# Patient Record
Sex: Male | Born: 1985
Health system: Southern US, Community
[De-identification: ages and names within clinical notes are randomized; demographics above are authoritative.]

## PROBLEM LIST (undated history)

## (undated) DIAGNOSIS — G4733 Obstructive sleep apnea (adult) (pediatric): Secondary | ICD-10-CM

## (undated) HISTORY — DX: Obstructive sleep apnea (adult) (pediatric): G47.33

---
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
## 2012-11-02 ENCOUNTER — Encounter (HOSPITAL_COMMUNITY): Payer: Self-pay | Admitting: Emergency Medicine

## 2012-11-02 ENCOUNTER — Emergency Department (INDEPENDENT_AMBULATORY_CARE_PROVIDER_SITE_OTHER)
Admission: EM | Admit: 2012-11-02 | Discharge: 2012-11-02 | Disposition: A | Discharge status: Home / Self Care | Payer: Self-pay | Source: Home / Self Care | Attending: Family Medicine | Admitting: Family Medicine

## 2012-11-02 DIAGNOSIS — J020 Streptococcal pharyngitis: Secondary | ICD-10-CM

## 2012-11-02 LAB — POCT RAPID STREP A: Streptococcus, Group A Screen (Direct): POSITIVE — AB

## 2012-11-02 MED ORDER — AMOXICILLIN 500 MG PO CAPS: 500.0000 mg | ORAL_CAPSULE | Freq: Three times a day (TID) | ORAL | Status: DC

## 2012-11-02 NOTE — ED Notes (Signed)
Pt c/o cough x1 week... Sx include: cough w/yellow/green sputum, sore throat... Denies: fevers, vomiting, nauseas, diarrhea, headaches... Pt is alert w/no signs of distress.

## 2012-11-02 NOTE — ED Provider Notes (Signed)
History     CSN: [id number]  Arrival date & time 11/02/12  1253   First MD Initiated Contact with Patient 11/02/12 1324      Chief Complaint  Patient presents with  . Cough    (Consider location/radiation/quality/duration/timing/severity/associated sxs/prior treatment) Patient is a 26 y.o. male presenting with cough. The history is provided by the patient.  Cough This is a new problem. The current episode started more than 1 week ago. The problem has been gradually worsening (worse past 2-3 days). The cough is non-productive. The maximum temperature recorded prior to his arrival was 100 to 100.9 F. Associated symptoms include chills and sore throat. Pertinent negatives include no rhinorrhea, no myalgias, no shortness of breath and no wheezing. He is not a smoker.    History reviewed. No pertinent past medical history.  History reviewed. No pertinent past surgical history.  No family history on file.  History  Substance Use Topics  . Smoking status: Never Smoker   . Smokeless tobacco: Not on file  . Alcohol Use: Yes      Review of Systems  Constitutional: Positive for chills.  HENT: Positive for sore throat. Negative for congestion and rhinorrhea.   Respiratory: Positive for cough. Negative for shortness of breath and wheezing.   Musculoskeletal: Negative for myalgias.    Allergies  Review of patient's allergies indicates no known allergies.  Home Medications   Current Outpatient Rx  Name  Route  Sig  Dispense  Refill  . AMOXICILLIN 500 MG PO CAPS   Oral   Take 1 capsule (500 mg total) by mouth 3 (three) times daily.   30 capsule   0     BP 117/84  Pulse 71  Temp 97.9 F (36.6 C) (Oral)  Resp 18  SpO2 100%  Physical Exam  Nursing note and vitals reviewed. Constitutional: He is oriented to person, place, and time. He appears well-developed and well-nourished.  HENT:  Head: Normocephalic.  Right Ear: External ear normal.  Left Ear: External ear  normal.  Mouth/Throat: Uvula is midline. No uvula swelling. Oropharyngeal exudate and posterior oropharyngeal erythema present. No tonsillar abscesses.  Pulmonary/Chest: Breath sounds normal.  Neurological: He is alert and oriented to person, place, and time.  Skin: Skin is warm and dry. No rash noted.    ED Course  Procedures (including critical care time)  Labs Reviewed  POCT RAPID STREP A ([facility] ONLY) - Abnormal; Notable for the following:    Streptococcus, Group A Screen (Direct) POSITIVE (*)     All other components within normal limits   No results found.   1. Strep sore throat       MDM  Strep pos.        Linna Hoff, MD 11/02/12 1622

## 2012-12-03 ENCOUNTER — Emergency Department (INDEPENDENT_AMBULATORY_CARE_PROVIDER_SITE_OTHER)
Admission: EM | Admit: 2012-12-03 | Discharge: 2012-12-03 | Disposition: A | Discharge status: Home / Self Care | Payer: Self-pay | Source: Home / Self Care | Attending: Emergency Medicine | Admitting: Emergency Medicine

## 2012-12-03 ENCOUNTER — Encounter (HOSPITAL_COMMUNITY): Payer: Self-pay | Admitting: Emergency Medicine

## 2012-12-03 DIAGNOSIS — J020 Streptococcal pharyngitis: Secondary | ICD-10-CM

## 2012-12-03 LAB — POCT RAPID STREP A: Streptococcus, Group A Screen (Direct): POSITIVE — AB

## 2012-12-03 MED ORDER — CEPHALEXIN 500 MG PO CAPS: 500.0000 mg | ORAL_CAPSULE | Freq: Three times a day (TID) | ORAL | Status: AC

## 2012-12-03 MED ORDER — AMOXICILLIN 500 MG PO CAPS: 500.0000 mg | ORAL_CAPSULE | Freq: Three times a day (TID) | ORAL | Status: DC

## 2012-12-03 MED ORDER — ACETAMINOPHEN-CODEINE #3 300-30 MG PO TABS: 1.0000 | ORAL_TABLET | Freq: Four times a day (QID) | ORAL | Status: DC | PRN

## 2012-12-03 NOTE — ED Provider Notes (Signed)
History     CSN: [id number]  Arrival date & time 12/03/12  1478   First MD Initiated Contact with Patient 12/03/12 [phone]      Chief Complaint  Patient presents with  . Sore Throat    sore throat since sunday. rx dx of strep a month ago. having same symptoms    (Consider location/radiation/quality/duration/timing/severity/associated sxs/prior treatment) HPI Comments: Patient presents urgent care complaining of a severe sore throat that started Monday, about 4 days ago. With the same symptoms he had about a month ago where he was treated with amoxicillin with a confirmed streptococcal pharyngitis. He describes he completed a full 10 day treatment course and felt much better. Is not interval this last Monday started having symptoms again. No cough, or congestion  Patient is a 26 y.o. male presenting with pharyngitis. The history is provided by the patient.  Sore Throat This is a new problem. The current episode started more than 2 days ago. The problem occurs constantly. The problem has not changed since onset.Pertinent negatives include no abdominal pain, no headaches and no shortness of breath. The symptoms are aggravated by swallowing. Nothing relieves the symptoms. He has tried acetaminophen for the symptoms. The treatment provided no relief.    History reviewed. No pertinent past medical history.  History reviewed. No pertinent past surgical history.  No family history on file.  History  Substance Use Topics  . Smoking status: Never Smoker   . Smokeless tobacco: Not on file  . Alcohol Use: Yes      Review of Systems  Constitutional: Positive for activity change and appetite change. Negative for fever, chills and fatigue.  HENT: Positive for sore throat and trouble swallowing. Negative for ear pain, neck pain, neck stiffness and ear discharge.   Respiratory: Negative for cough and shortness of breath.   Gastrointestinal: Negative for abdominal pain.  Skin: Negative for rash  and wound.  Neurological: Negative for dizziness and headaches.    Allergies  Review of patient's allergies indicates no known allergies.  Home Medications   Current Outpatient Rx  Name  Route  Sig  Dispense  Refill  . ACETAMINOPHEN-CODEINE #3 300-30 MG PO TABS   Oral   Take 1-2 tablets by mouth every 6 (six) hours as needed for pain.   15 tablet   0   . AMOXICILLIN 500 MG PO CAPS   Oral   Take 1 capsule (500 mg total) by mouth 3 (three) times daily.   30 capsule   0   . CEPHALEXIN 500 MG PO CAPS   Oral   Take 1 capsule (500 mg total) by mouth 3 (three) times daily.   20 capsule   0     BP 128/72  Pulse 86  Temp 99.3 F (37.4 C) (Oral)  Resp 20  SpO2 97%  Physical Exam  Nursing note and vitals reviewed. Constitutional: Vital signs are normal. He appears well-developed and well-nourished.  Non-toxic appearance. He does not have a sickly appearance. He does not appear ill. No distress.  HENT:  Head: Normocephalic.  Mouth/Throat: Uvula is midline. Oropharyngeal exudate and posterior oropharyngeal erythema present. No posterior oropharyngeal edema or tonsillar abscesses.  Eyes: Conjunctivae normal are normal.  Neck: No JVD present. No tracheal deviation present. No thyromegaly present.  Cardiovascular: Normal rate.   Pulmonary/Chest: Effort normal and breath sounds normal.  Abdominal: Soft.  Lymphadenopathy:    He has cervical adenopathy.  Neurological: He is alert.  Skin: No rash noted. No erythema.  ED Course  Procedures (including critical care time)  Labs Reviewed  POCT RAPID STREP A ([facility] ONLY) - Abnormal; Notable for the following:    Streptococcus, Group A Screen (Direct) POSITIVE (*)     All other components within normal limits   No results found.   1. Streptococcal sore throat       MDM  Streptococcal pharyngitis. On this instance we will treat Mr. Resendes with a cephalosporin 10 day treatment course as he completed a penicillin  course about a month ago. No signs of a peritonsillar abscess. Patient was        Jimmie Molly, MD 12/03/12 1019

## 2012-12-03 NOTE — ED Notes (Signed)
Pt c/o severe sore throat and temp last p.m. Pt states he had a recent dx of strep a month ago and feels like it never really cleared completely. Pt did finish all meds. Same symptoms started back this past Sunday. Pt denies n/v/d

## 2014-07-23 ENCOUNTER — Encounter (HOSPITAL_COMMUNITY): Payer: Self-pay | Admitting: Emergency Medicine

## 2014-07-23 ENCOUNTER — Emergency Department (HOSPITAL_COMMUNITY)
Admission: EM | Admit: 2014-07-23 | Discharge: 2014-07-23 | Disposition: A | Discharge status: Home / Self Care | Payer: Self-pay | Attending: Emergency Medicine | Admitting: Emergency Medicine

## 2014-07-23 DIAGNOSIS — Z792 Long term (current) use of antibiotics: Secondary | ICD-10-CM | POA: Insufficient documentation

## 2014-07-23 DIAGNOSIS — L02214 Cutaneous abscess of groin: Secondary | ICD-10-CM

## 2014-07-23 DIAGNOSIS — L02219 Cutaneous abscess of trunk, unspecified: Secondary | ICD-10-CM | POA: Insufficient documentation

## 2014-07-23 DIAGNOSIS — L03319 Cellulitis of trunk, unspecified: Principal | ICD-10-CM

## 2014-07-23 DIAGNOSIS — Z79899 Other long term (current) drug therapy: Secondary | ICD-10-CM | POA: Insufficient documentation

## 2014-07-23 MED ORDER — CEPHALEXIN 500 MG PO CAPS: 500.0000 mg | ORAL_CAPSULE | Freq: Four times a day (QID) | ORAL | Status: DC

## 2014-07-23 MED ORDER — NAPROXEN 500 MG PO TABS: 500.0000 mg | ORAL_TABLET | Freq: Two times a day (BID) | ORAL | Status: DC

## 2014-07-23 NOTE — ED Notes (Signed)
Pt in c/o hard bump to left groin area since Thursday, denies drainage

## 2014-07-23 NOTE — ED Provider Notes (Signed)
CSN: [id number]     Arrival date & time 07/23/14  1527 History  This chart was scribed for non-physician practitioner working with Rolan BuccoMelanie Belfi, MD, by Jarvis Morganaylor Ferguson, ED Scribe. This patient was seen in room TR11C/TR11C and the patient's care was started at 3:51 PM.    Chief Complaint  Patient presents with  . Abscess      The history is provided by the patient. No language interpreter was used.   HPI Comments: Lonnie Cantrell is a 28 y.o. male who presents to the Emergency Department complaining of constant, gradually worsening, "sore and hard", abscess to his left groin since Thursday. Patient states he has had abscesses before. Patient states that he put a hot compress on it last night but states it was too painful. Patient denies any drainage to the area, penile discharge or penile swelling.    History reviewed. No pertinent past medical history. History reviewed. No pertinent past surgical history. History reviewed. No pertinent family history. History  Substance Use Topics  . Smoking status: Never Smoker   . Smokeless tobacco: Not on file  . Alcohol Use: Yes    Review of Systems  Genitourinary: Negative for discharge, penile swelling and penile pain.  Skin:       Abscess to left groin area  All other systems reviewed and are negative.     Allergies  Review of patient's allergies indicates no known allergies.  Home Medications   Prior to Admission medications   Medication Sig Start Date End Date Taking? Authorizing Provider  acetaminophen-codeine (TYLENOL #3) 300-30 MG per tablet Take 1-2 tablets by mouth every 6 (six) hours as needed for pain. 12/03/12   Jimmie MollyPaolo Coll, MD  amoxicillin (AMOXIL) 500 MG capsule Take 1 capsule (500 mg total) by mouth 3 (three) times daily. 11/02/12   Linna HoffJames D Kindl, MD  cephALEXin (KEFLEX) 500 MG capsule Take 1 capsule (500 mg total) by mouth 4 (four) times daily. 07/23/14   Dariyon Urquilla L Electra Paladino, PA-C   BP 126/66  Pulse 75  Temp(Src)  98.5 F (36.9 C) (Oral)  Resp 20  SpO2 97%  Physical Exam  Nursing note and vitals reviewed. Constitutional: He is oriented to person, place, and time. He appears well-developed and well-nourished. No distress.  HENT:  Head: Normocephalic and atraumatic.  Right Ear: External ear normal.  Left Ear: External ear normal.  Nose: Nose normal.  Mouth/Throat: Oropharynx is clear and moist.  Eyes: Conjunctivae are normal.  Neck: Normal range of motion. Neck supple.  Cardiovascular: Normal rate, regular rhythm and normal heart sounds.   Pulmonary/Chest: Effort normal and breath sounds normal.  Abdominal: Soft.  Musculoskeletal: Normal range of motion.  Neurological: He is alert and oriented to person, place, and time.  Skin: Skin is warm and dry. He is not diaphoretic.     Psychiatric: He has a normal mood and affect.    ED Course  Procedures (including critical care time) Medications - No data to display   DIAGNOSTIC STUDIES: Oxygen Saturation is 97% on RA, normal by my interpretation.    COORDINATION OF CARE:    Labs Review Labs Reviewed - No data to display  Imaging Review No results found.   EKG Interpretation None      MDM   Final diagnoses:  Abscess of groin, left    Filed Vitals:   07/23/14 1537  BP: 126/66  Pulse: 75  Temp: 98.5 F (36.9 C)  Resp: 20   Afebrile, NAD, non-toxic appearing, AAOx4.  Patient with skin abscess, area is indurated, no fluctuance. No surrounding erythema.  No drained at this visit. Encouraged home warm soaks and compresses at home.  Return precautions discussed. Patient is agreeable to plan. Patient is stable at time of discharge     I personally performed the services described in this documentation, which was scribed in my presence. The recorded information has been reviewed and is accurate.      Jeannetta Ellis, PA-C 07/23/14 1621

## 2014-07-23 NOTE — Discharge Instructions (Signed)
Please follow up with your primary care physician in 1-2 days. Please return to the ER if your abscess worsens or shows signs of needing draining. If you do not have one please call the [facility] number listed above. Please take your antibiotic until completion. Please read all discharge instructions and return precautions.    Abscess An abscess is an infected area that contains a collection of pus and debris.It can occur in almost any part of the body. An abscess is also known as a furuncle or boil. CAUSES  An abscess occurs when tissue gets infected. This can occur from blockage of oil or sweat glands, infection of hair follicles, or a minor injury to the skin. As the body tries to fight the infection, pus collects in the area and creates pressure under the skin. This pressure causes pain. People with weakened immune systems have difficulty fighting infections and get certain abscesses more often.  SYMPTOMS Usually an abscess develops on the skin and becomes a painful mass that is red, warm, and tender. If the abscess forms under the skin, you may feel a moveable soft area under the skin. Some abscesses break open (rupture) on their own, but most will continue to get worse without care. The infection can spread deeper into the body and eventually into the bloodstream, causing you to feel ill.  DIAGNOSIS  Your caregiver will take your medical history and perform a physical exam. A sample of fluid may also be taken from the abscess to determine what is causing your infection. TREATMENT  Your caregiver may prescribe antibiotic medicines to fight the infection. However, taking antibiotics alone usually does not cure an abscess. Your caregiver may need to make a small cut (incision) in the abscess to drain the pus. In some cases, gauze is packed into the abscess to reduce pain and to continue draining the area. HOME CARE INSTRUCTIONS   Only take over-the-counter or prescription  medicines for pain, discomfort, or fever as directed by your caregiver.  If you were prescribed antibiotics, take them as directed. Finish them even if you start to feel better.  If gauze is used, follow your caregiver's directions for changing the gauze.  To avoid spreading the infection:  Keep your draining abscess covered with a bandage.  Wash your hands well.  Do not share personal care items, towels, or whirlpools with others.  Avoid skin contact with others.  Keep your skin and clothes clean around the abscess.  Keep all follow-up appointments as directed by your caregiver. SEEK MEDICAL CARE IF:   You have increased pain, swelling, redness, fluid drainage, or bleeding.  You have muscle aches, chills, or a general ill feeling.  You have a fever. MAKE SURE YOU:   Understand these instructions.  Will watch your condition.  Will get help right away if you are not doing well or get worse. Document Released: 09/25/2005 Document Revised: 06/16/2012 Document Reviewed: 02/28/2012 [facility] Patient Information 2015 [employer] [place]. This information is not intended to replace advice given to you by your health care provider. Make sure you discuss any questions you have with your health care provider.

## 2014-07-24 NOTE — ED Provider Notes (Signed)
Medical screening examination/treatment/procedure(s) were performed by non-physician practitioner and as supervising physician I was immediately available for consultation/collaboration.   EKG Interpretation None        Aela Bohan, MD 07/24/14 0008 

## 2014-07-28 ENCOUNTER — Encounter (HOSPITAL_COMMUNITY): Payer: Self-pay | Admitting: Emergency Medicine

## 2014-07-28 ENCOUNTER — Emergency Department (HOSPITAL_COMMUNITY)
Admission: EM | Admit: 2014-07-28 | Discharge: 2014-07-28 | Disposition: A | Discharge status: Home / Self Care | Payer: Self-pay | Attending: Emergency Medicine | Admitting: Emergency Medicine

## 2014-07-28 DIAGNOSIS — Z792 Long term (current) use of antibiotics: Secondary | ICD-10-CM | POA: Insufficient documentation

## 2014-07-28 DIAGNOSIS — L03119 Cellulitis of unspecified part of limb: Secondary | ICD-10-CM

## 2014-07-28 DIAGNOSIS — Z79899 Other long term (current) drug therapy: Secondary | ICD-10-CM | POA: Insufficient documentation

## 2014-07-28 DIAGNOSIS — L02419 Cutaneous abscess of limb, unspecified: Secondary | ICD-10-CM | POA: Insufficient documentation

## 2014-07-28 DIAGNOSIS — R229 Localized swelling, mass and lump, unspecified: Secondary | ICD-10-CM | POA: Insufficient documentation

## 2014-07-28 DIAGNOSIS — L989 Disorder of the skin and subcutaneous tissue, unspecified: Secondary | ICD-10-CM

## 2014-07-28 MED ORDER — HYDROCODONE-ACETAMINOPHEN 5-325 MG PO TABS: 1.0000 | ORAL_TABLET | Freq: Four times a day (QID) | ORAL | Status: DC | PRN

## 2014-07-28 MED ORDER — HYDROCODONE-ACETAMINOPHEN 5-325 MG PO TABS
1.0000 | ORAL_TABLET | Freq: Once | ORAL | Status: AC
  Administered 2014-07-28: 1 via ORAL
  Filled 2014-07-28: qty 1

## 2014-07-28 NOTE — ED Provider Notes (Signed)
CSN: [id number]     Arrival date & time 07/28/14  0001 History   First MD Initiated Contact with Patient 07/28/14 [phone]     Chief Complaint  Patient presents with  . Abscess     (Consider location/radiation/quality/duration/timing/severity/associated sxs/prior Treatment) Patient is a 28 y.o. male presenting with abscess. The history is provided by the patient.  Abscess Location:  Leg Leg abscess location:  L leg Abscess quality: itching and painful   Abscess quality: not draining, no fluctuance, no induration, no warmth and not weeping   Red streaking: no   Progression:  Unchanged Pain details:    Quality:  Burning   Severity:  Moderate   Timing:  Constant   Progression:  Unchanged Chronicity:  New Context: not diabetes, not immunosuppression, not injected drug use, not insect bite/sting and not skin injury   Relieved by:  Nothing Worsened by:  Warm compresses Associated symptoms: no fever and no nausea     History reviewed. No pertinent past medical history. History reviewed. No pertinent past surgical history. No family history on file. History  Substance Use Topics  . Smoking status: Never Smoker   . Smokeless tobacco: Not on file  . Alcohol Use: Yes    Review of Systems  Constitutional: Negative for fever.  Respiratory: Negative for shortness of breath.   Cardiovascular: Negative for chest pain.  Gastrointestinal: Negative for nausea.  Skin: Positive for wound.  Neurological: Negative for dizziness.  All other systems reviewed and are negative.     Allergies  Review of patient's allergies indicates no known allergies.  Home Medications   Prior to Admission medications   Medication Sig Start Date End Date Taking? Authorizing Provider  cephALEXin (KEFLEX) 500 MG capsule Take 1 capsule (500 mg total) by mouth 4 (four) times daily. 07/23/14  Yes Jennifer L Piepenbrink, PA-C  naproxen (NAPROSYN) 500 MG tablet Take 1 tablet (500 mg total) by mouth 2 (two) times  daily with a meal. 07/23/14  Yes Jennifer L Piepenbrink, PA-C  HYDROcodone-acetaminophen (NORCO/VICODIN) 5-325 MG per tablet Take 1 tablet by mouth every 6 (six) hours as needed for moderate pain. 07/28/14   Arman Filter, NP   BP 113/71  Pulse 71  Temp(Src) 98.7 F (37.1 C) (Oral)  Resp 18  SpO2 93% Physical Exam  Nursing note and vitals reviewed. Constitutional: He appears well-developed and well-nourished.  HENT:  Head: Normocephalic.  Eyes: Pupils are equal, round, and reactive to light.  Neck: Normal range of motion.  Cardiovascular: Normal rate.   Pulmonary/Chest: Effort normal.  Musculoskeletal: Normal range of motion.  Neurological: He is alert.  Skin: Skin is warm and dry. There is erythema.       ED Course  INCISION AND DRAINAGE Date/Time: 07/28/2014 4:09 AM Performed by: Arman Filter Authorized by: Arman Filter Consent: Verbal consent obtained. written consent not obtained. Patient understanding: patient states understanding of the procedure being performed Patient identity confirmed: verbally with patient Type: abscess Body area: lower extremity Location details: left leg Anesthesia: local infiltration Anesthetic total: 2 ml Patient sedated: no Scalpel size: 11 Needle gauge: 22 Incision type: elliptical Complexity: simple Drainage amount: none. Wound treatment: wound left open Patient tolerance: Patient tolerated the procedure well with no immediate complications.   (including critical care time) Labs Review Labs Reviewed - No data to display  Imaging Review No results found.   EKG Interpretation None      MDM   Final diagnoses:  Skin lesion of left leg  Arman FilterGail K Dixie Jafri, NP 07/28/14 [phone]

## 2014-07-28 NOTE — Discharge Instructions (Signed)
I tried to open the abscess but there was no pus within the lesion  Continue taking the antibiotic

## 2014-07-28 NOTE — ED Notes (Signed)
Pt. reports persistent abscess at left groin with no drainage seen here 07/23/2014 prescribed with antibiotic and pain medication with no improvement.

## 2014-07-28 NOTE — ED Provider Notes (Signed)
Medical screening examination/treatment/procedure(s) were performed by non-physician practitioner and as supervising physician I was immediately available for consultation/collaboration.   EKG Interpretation None       Jolena Kittle K Saide Lanuza-Rasch, MD 07/28/14 0534 

## 2018-04-03 ENCOUNTER — Ambulatory Visit (HOSPITAL_COMMUNITY)
Admission: EM | Admit: 2018-04-03 | Discharge: 2018-04-03 | Disposition: A | Discharge status: Home / Self Care | Payer: 59 | Attending: Internal Medicine | Admitting: Internal Medicine

## 2018-04-03 ENCOUNTER — Encounter (HOSPITAL_COMMUNITY): Payer: Self-pay | Admitting: Emergency Medicine

## 2018-04-03 DIAGNOSIS — Z79899 Other long term (current) drug therapy: Secondary | ICD-10-CM | POA: Insufficient documentation

## 2018-04-03 DIAGNOSIS — J029 Acute pharyngitis, unspecified: Secondary | ICD-10-CM | POA: Diagnosis not present

## 2018-04-03 LAB — POCT RAPID STREP A: Streptococcus, Group A Screen (Direct): NEGATIVE

## 2018-04-03 NOTE — Discharge Instructions (Addendum)
Sore Throat  Your rapid strep tested Negative today. We will send for a culture and call in about 2 days if results are positive. For now we will treat your sore throat as a virus with symptom management.   Please continue Tylenol or Ibuprofen for fever and pain. May try salt water gargles, cepacol lozenges, throat spray, or OTC cold relief medicine for throat discomfort. If you also have congestion take a daily anti-histamine like Zyrtec, Claritin, and a oral decongestant to help with post nasal drip that may be irritating your throat.   For cough please use delsym, robitussin or robitussin DM  Stay hydrated and drink plenty of fluids to keep your throat coated relieve irritation.

## 2018-04-03 NOTE — ED Provider Notes (Signed)
[facility]    CSN: [id number] Arrival date & time: 04/03/18  1545     History   Chief Complaint Chief Complaint  Patient presents with  . Sore Throat    HPI Lonnie Cantrell is a 32 y.o. male Patient is presenting with URI symptoms- congestion, cough, sore throat. Patient's main complaints are sore throat. Symptoms have been going on for 4-5 days. Patient has not tried anything for relief. Denies fever, nausea, vomiting, diarrhea. Denies shortness of breath and chest pain.  Denies history of asthma or smoking.   HPI  History reviewed. No pertinent past medical history.  There are no active problems to display for this patient.   History reviewed. No pertinent surgical history.     Home Medications    Prior to Admission medications   Medication Sig Start Date End Date Taking? Authorizing Provider  cephALEXin (KEFLEX) 500 MG capsule Take 1 capsule (500 mg total) by mouth 4 (four) times daily. 07/23/14   Piepenbrink, Victorino Dike, PA-C  HYDROcodone-acetaminophen (NORCO/VICODIN) 5-325 MG per tablet Take 1 tablet by mouth every 6 (six) hours as needed for moderate pain. 07/28/14   Earley Favor, NP  naproxen (NAPROSYN) 500 MG tablet Take 1 tablet (500 mg total) by mouth 2 (two) times daily with a meal. 07/23/14   Piepenbrink, Victorino Dike, PA-C    Family History History reviewed. No pertinent family history.  Social History Social History   Tobacco Use  . Smoking status: Never Smoker  Substance Use Topics  . Alcohol use: Yes  . Drug use: Yes    Types: Marijuana     Allergies   Patient has no known allergies.   Review of Systems Review of Systems  Constitutional: Negative for activity change, appetite change, fatigue and fever.  HENT: Positive for congestion, postnasal drip, rhinorrhea and sinus pressure. Negative for ear pain and sore throat.   Eyes: Negative for pain and itching.  Respiratory: Positive for cough. Negative for shortness of breath.     Cardiovascular: Negative for chest pain.  Gastrointestinal: Negative for abdominal pain, diarrhea, nausea and vomiting.  Musculoskeletal: Negative for myalgias.  Skin: Negative for rash.  Neurological: Negative for dizziness, light-headedness and headaches.     Physical Exam Triage Vital Signs ED Triage Vitals [04/03/18 1641]  Enc Vitals Group     BP 138/90     Pulse Rate 89     Resp 18     Temp 98.2 F (36.8 C)     Temp Source Oral     SpO2 98 %     Weight      Height      Head Circumference      Peak Flow      Pain Score      Pain Loc      Pain Edu?      Excl. in GC?    No data found.  Updated Vital Signs BP 138/90 (BP Location: Right Arm)   Pulse 89   Temp 98.2 F (36.8 C) (Oral)   Resp 18   SpO2 98%   Visual Acuity Right Eye Distance:   Left Eye Distance:   Bilateral Distance:    Right Eye Near:   Left Eye Near:    Bilateral Near:     Physical Exam  Constitutional: He appears well-developed and well-nourished.  HENT:  Head: Normocephalic and atraumatic.  Bilateral TMs nonerythematous, nasal mucosa erythematous with clear rhinorrhea present, posterior oropharynx erythematous, moderate tonsillar enlargement, white exudate present on left  tonsil, no uvula deviation or swelling  Eyes: Conjunctivae are normal.  Neck: Neck supple.  Cardiovascular: Normal rate and regular rhythm.  No murmur heard. Pulmonary/Chest: Effort normal and breath sounds normal. No respiratory distress.  Breathing comfortably at rest, CTA BL  Abdominal: Soft. There is no tenderness.  Musculoskeletal: He exhibits no edema.  Neurological: He is alert.  Skin: Skin is warm and dry.  Psychiatric: He has a normal mood and affect.  Nursing note and vitals reviewed.    UC Treatments / Results  Labs (all labs ordered are listed, but only abnormal results are displayed) Labs Reviewed  CULTURE, GROUP A STREP [facility])  POCT RAPID STREP A    EKG None Radiology No results  found.  Procedures Procedures (including critical care time)  Medications Ordered in UC Medications - No data to display   Initial Impression / Assessment and Plan / UC Course  I have reviewed the triage vital signs and the nursing notes.  Pertinent labs & imaging results that were available during my care of the patient were reviewed by me and considered in my medical decision making (see chart for details).     Patient tested negative for strep. No evidence of peritonsillar abscess or retropharyngeal abscess. Patient is nontoxic appearing, no drooling, dysphagia, muffled voice, or tripoding. No trismus.  Sore throat likely viral versus postnasal drainage.  Will recommend beginning daily allergy pill.  Discussed other further symptomatic management of sore throat as symptoms resolve.  Expect gradual self resolution. Discussed strict return precautions. Patient verbalized understanding and is agreeable with plan.     Final Clinical Impressions(s) / UC Diagnoses   Final diagnoses:  Sore throat    ED Discharge Orders    None       Controlled Substance Prescriptions [place] Controlled Substance Registry consulted? Not Applicable   Lew DawesWieters, Halleigh Comes C, [place]PA-C 04/03/18 1723

## 2018-04-03 NOTE — ED Triage Notes (Signed)
Pt sts sore throat x 3 days  

## 2018-04-06 LAB — CULTURE, GROUP A STREP (THRC)

## 2018-10-20 DIAGNOSIS — J209 Acute bronchitis, unspecified: Secondary | ICD-10-CM | POA: Diagnosis not present

## 2018-10-20 DIAGNOSIS — J9801 Acute bronchospasm: Secondary | ICD-10-CM | POA: Diagnosis not present

## 2018-10-20 DIAGNOSIS — R0982 Postnasal drip: Secondary | ICD-10-CM | POA: Diagnosis not present

## 2018-12-02 ENCOUNTER — Encounter: Payer: Self-pay | Admitting: Osteopathic Medicine

## 2018-12-02 ENCOUNTER — Ambulatory Visit (INDEPENDENT_AMBULATORY_CARE_PROVIDER_SITE_OTHER): Payer: 59 | Admitting: Osteopathic Medicine

## 2018-12-02 VITALS — BP 125/67 | HR 75 | Temp 98.4°F | Ht 67.25 in | Wt 277.7 lb

## 2018-12-02 DIAGNOSIS — R0681 Apnea, not elsewhere classified: Secondary | ICD-10-CM | POA: Diagnosis not present

## 2018-12-02 DIAGNOSIS — Z6841 Body Mass Index (BMI) 40.0 and over, adult: Secondary | ICD-10-CM | POA: Insufficient documentation

## 2018-12-02 DIAGNOSIS — Z9189 Other specified personal risk factors, not elsewhere classified: Secondary | ICD-10-CM | POA: Diagnosis not present

## 2018-12-02 DIAGNOSIS — L659 Nonscarring hair loss, unspecified: Secondary | ICD-10-CM | POA: Insufficient documentation

## 2018-12-02 DIAGNOSIS — R635 Abnormal weight gain: Secondary | ICD-10-CM

## 2018-12-02 DIAGNOSIS — R400 Somnolence: Secondary | ICD-10-CM

## 2018-12-02 DIAGNOSIS — R5383 Other fatigue: Secondary | ICD-10-CM

## 2018-12-02 DIAGNOSIS — Z113 Encounter for screening for infections with a predominantly sexual mode of transmission: Secondary | ICD-10-CM

## 2018-12-02 DIAGNOSIS — E66813 Obesity, class 3: Secondary | ICD-10-CM

## 2018-12-02 NOTE — Patient Instructions (Signed)
Weight loss: important things to remember  It is hard work! You will have setbacks, but don't get discouraged. The goal is not short-term success, it is long-term health.   Looking at the numbers is important to track your progress and set goals, but how you are feeling and your overall health are the most important things!   You can do this!!!   Things to remember for exercise for weight loss:   Please note - I am not a certified personal trainer. I can present you with ideas and general workout goals, but an exercise program is largely up to you. Find something you can stick with, and something you enjoy!   As you progress in your exercise regimen think about gradually increasing the following, week by week:   intensity (how strenuous is your workout)  frequency (how often you are exercising)  duration (how many minutes at a time you are exercising)  Walking for 20 minutes a day is certainly better than nothing, but more strenuous exercise will develop better cardiovascular fitness.   interval training (high-intensity alternating with low-intensity, think walk/jog rather than just walk)  muscle strengthening exercises (weight lifting, calisthenics, yoga) - this also helps prevent osteoporosis!   Things to remember for diet changes for weight loss:   Please note - I am not a certified dietician. I can present you with ideas and general diet goals, but a meal plan is largely up to you. I am happy to refer you to a dietician who can give you a detailed meal plan.  Apps/logs are helpful to track how you're eating! It's not realistic to be logging everything you eat forever, but when you're starting a healthy eating lifestyle it's very helpful, and checking in with logs now and then helps you stick to your program!   Calorie restriction with the goal weight loss of no more than one to one and a half pounds per week.   Increase lean protein such as chicken, fish, [place]key.   Decrease  fatty foods such as dairy, butter.   Decrease sugary foods. Avoid sugary drinks such as soda or juice.  Increase fiber found in fruit and vegetables and whole graines.

## 2018-12-02 NOTE — Progress Notes (Signed)
HPI: Lonnie Cantrell. is a 32 y.o. male who  has no past medical history on file.  he presents to [facility] today, 12/02/18,  for chief complaint of: New to establish care Concern for sleep apnea Weight gain Hair loss   Hasn't had a PCP in some time, would just like to get established.   Never smoker.  Rare EtOH use.  Cisgender male heterosexual, one partner, using condoms, interested in STI screening today.   Concerned about weight gain. Doesn't eat well, no formal exercise.   Concerned about hair loss. Runs in his family.   Reports sleep problems - concerned about apnea.   STOP-BANG for SLEEP APNEA Do you Snore loudly? Yes Do you often feel Tired during day? Yes Has anyone Observed you stop breathing? Yes History of high blood Pressure? No BMI >35? Yes Age >50? No Neck circumference >16 in? Yes Gender male? Yes  5-8 = high risk 3-4 = intermediate 0-2 = low risk          Past medical, surgical, social and family history reviewed:  There are no active problems to display for this patient.   History reviewed. No pertinent surgical history.  Social History   Tobacco Use  . Smoking status: Never Smoker  . Smokeless tobacco: Never Used  Substance Use Topics  . Alcohol use: Yes    Family History  Problem Relation Age of Onset  . Pancreatic cancer Mother   . Ovarian cancer Paternal Grandmother      Current medication list and allergy/intolerance information reviewed:    No current outpatient medications on file.   No current facility-administered medications for this visit.     No Known Allergies    Review of Systems:  Constitutional:  No  fever, no chills, No recent illness, No unintentional weight changes. +significant fatigue.   HEENT: No  headache, no vision change, no hearing change, No sore throat, No  sinus pressure  Cardiac: No  chest pain, No  pressure, No palpitations, No   Orthopnea  Respiratory:  No  shortness of breath. No  Cough  Gastrointestinal: No  abdominal pain, No  nausea, No  vomiting,  No  blood in stool, No  diarrhea, No  constipation   Musculoskeletal: No new myalgia/arthralgia  Skin: No  Rash, No other wounds/concerning lesions  Genitourinary: No  incontinence, No  abnormal genital bleeding, No abnormal genital discharge  Hem/Onc: No  easy bruising/bleeding, No  abnormal lymph node  Endocrine: No cold intolerance,  No heat intolerance. No polyuria/polydipsia/polyphagia   Neurologic: No  weakness, No  dizziness, No  slurred speech/focal weakness/facial droop  Psychiatric: No  concerns with depression, No  concerns with anxiety, +sleep problems, No mood problems    Depression screen [facility] 2/9 12/02/2018  Decreased Interest 0  Down, Depressed, Hopeless 0  PHQ - 2 Score 0      Exam:  BP 125/67   Pulse 75   Temp 98.4 F (36.9 C) (Oral)   Ht 5' 7.25" (1.708 m)   Wt 277 lb 11.2 oz (126 kg)   BMI 43.17 kg/m   Constitutional: VS see above. General Appearance: alert, well-developed, well-nourished, NAD  Eyes: Normal lids and conjunctive, non-icteric sclera  Ears, Nose, Mouth, Throat: MMM, Normal external inspection ears/nares/mouth/lips/gums. TM normal bilaterally. Pharynx/tonsils no erythema, no exudate. Nasal mucosa normal.   Neck: No masses, trachea midline. No thyroid enlargement. No tenderness/mass appreciated. No lymphadenopathy  Respiratory: Normal respiratory effort. no wheeze, no  rhonchi, no rales  Cardiovascular: S1/S2 normal, no murmur, no rub/gallop auscultated. RRR. No lower extremity edema.   Gastrointestinal: Nontender, no masses. No hepatomegaly, no splenomegaly. No hernia appreciated. Bowel sounds normal. Rectal exam deferred.   Musculoskeletal: Gait normal. No clubbing/cyanosis of digits.   Neurological: Normal balance/coordination. No tremor. No cranial nerve deficit on limited exam. Motor and sensation intact  and symmetric. Cerebellar reflexes intact.   Skin: warm, dry, intact. No rash/ulcer. No concerning nevi or subq nodules on limited exam.    Psychiatric: Normal judgment/insight. Normal mood and affect. Oriented x3.      ASSESSMENT/PLAN: The primary encounter diagnosis was Daytime somnolence. Diagnoses of Apnea spell, At risk for obstructive sleep apnea, Other fatigue, Weight gain, Hair loss, Routine screening for STI (sexually transmitted infection), and Class 3 severe obesity due to excess calories without serious comorbidity with body mass index (BMI) of 40.0 to 44.9 in adult [facility](HCC) were also pertinent to this visit.    Orders Placed This Encounter  Procedures  . CBC  . COMPLETE METABOLIC PANEL WITH GFR  . Lipid panel  . TSH  . Ambulatory referral to Sleep Studies      Patient Instructions  Weight loss: important things to remember  It is hard work! You will have setbacks, but don't get discouraged. The goal is not short-term success, it is long-term health.   Looking at the numbers is important to track your progress and set goals, but how you are feeling and your overall health are the most important things!   You can do this!!!   Things to remember for exercise for weight loss:   Please note - I am not a certified personal trainer. I can present you with ideas and general workout goals, but an exercise program is largely up to you. Find something you can stick with, and something you enjoy!   As you progress in your exercise regimen think about gradually increasing the following, week by week:   intensity (how strenuous is your workout)  frequency (how often you are exercising)  duration (how many minutes at a time you are exercising)  Walking for 20 minutes a day is certainly better than nothing, but more strenuous exercise will develop better cardiovascular fitness.   interval training (high-intensity alternating with low-intensity, think walk/jog rather than just  walk)  muscle strengthening exercises (weight lifting, calisthenics, yoga) - this also helps prevent osteoporosis!   Things to remember for diet changes for weight loss:   Please note - I am not a certified dietician. I can present you with ideas and general diet goals, but a meal plan is largely up to you. I am happy to refer you to a dietician who can give you a detailed meal plan.  Apps/logs are helpful to track how you're eating! It's not realistic to be logging everything you eat forever, but when you're starting a healthy eating lifestyle it's very helpful, and checking in with logs now and then helps you stick to your program!   Calorie restriction with the goal weight loss of no more than one to one and a half pounds per week.   Increase lean protein such as chicken, fish, [place]key.   Decrease fatty foods such as dairy, butter.   Decrease sugary foods. Avoid sugary drinks such as soda or juice.  Increase fiber found in fruit and vegetables and whole graines.          Visit summary with medication list and pertinent instructions was printed  for patient to review. All questions at time of visit were answered - patient instructed to contact office with any additional concerns or updates. ER/RTC precautions were reviewed with the patient.     Please note: voice recognition software was used to produce this document, and typos may escape review. Please contact Dr. Lyn Hollingshead for any needed clarifications.     Follow-up plan: Return in about 6 months (around 06/03/2019) for annual physical and preventive wellness exam  - sooner if needed .

## 2018-12-04 DIAGNOSIS — R400 Somnolence: Secondary | ICD-10-CM | POA: Diagnosis not present

## 2018-12-04 DIAGNOSIS — R5383 Other fatigue: Secondary | ICD-10-CM | POA: Diagnosis not present

## 2018-12-04 DIAGNOSIS — R635 Abnormal weight gain: Secondary | ICD-10-CM | POA: Diagnosis not present

## 2018-12-05 LAB — COMPLETE METABOLIC PANEL WITH GFR
AG Ratio: 1.4 (calc) (ref 1.0–2.5)
ALT: 22 U/L (ref 9–46)
AST: 16 U/L (ref 10–40)
Albumin: 4.2 g/dL (ref 3.6–5.1)
Alkaline phosphatase (APISO): 76 U/L (ref 40–115)
BUN: 15 mg/dL (ref 7–25)
CO2: 26 mmol/L (ref 20–32)
Calcium: 9.7 mg/dL (ref 8.6–10.3)
Chloride: 104 mmol/L (ref 98–110)
Creat: 1.03 mg/dL (ref 0.60–1.35)
GFR, Est African American: 111 mL/min/{1.73_m2} (ref >=60)
GFR, Est Non African American: 96 mL/min/{1.73_m2} (ref >=60)
Globulin: 2.9 g/dL (calc) (ref 1.9–3.7)
Glucose, Bld: 83 mg/dL (ref 65–99)
Potassium: 4.2 mmol/L (ref 3.5–5.3)
Sodium: 139 mmol/L (ref 135–146)
Total Bilirubin: 0.4 mg/dL (ref 0.2–1.2)
Total Protein: 7.1 g/dL (ref 6.1–8.1)

## 2018-12-05 LAB — C. TRACHOMATIS/N. GONORRHOEAE RNA
C. trachomatis RNA, TMA: NOT DETECTED
N. gonorrhoeae RNA, TMA: NOT DETECTED

## 2018-12-05 LAB — CBC
HCT: 46.5 % (ref 38.5–50.0)
Hemoglobin: 15.4 g/dL (ref 13.2–17.1)
MCH: 27.8 pg (ref 27.0–33.0)
MCHC: 33.1 g/dL (ref 32.0–36.0)
MCV: 83.9 fL (ref 80.0–100.0)
MPV: 11.8 fL (ref 7.5–12.5)
Platelets: 246 10*3/uL (ref 140–400)
RBC: 5.54 10*6/uL (ref 4.20–5.80)
RDW: 13.3 % (ref 11.0–15.0)
WBC: 9.7 10*3/uL (ref 3.8–10.8)

## 2018-12-05 LAB — TSH: TSH: 2.86 mIU/L (ref 0.40–4.50)

## 2018-12-05 LAB — LIPID PANEL
Cholesterol: 131 mg/dL (ref <200)
HDL: 28 mg/dL — ABNORMAL LOW (ref >40)
LDL Cholesterol (Calc): 85 mg/dL (calc)
Non-HDL Cholesterol (Calc): 103 mg/dL (calc) (ref <130)
Total CHOL/HDL Ratio: 4.7 (calc) (ref <5.0)
Triglycerides: 87 mg/dL (ref <150)

## 2018-12-07 LAB — HEPATITIS B SURFACE ANTIGEN: Hepatitis B Surface Ag: NONREACTIVE

## 2018-12-07 LAB — HIV ANTIBODY (ROUTINE TESTING W REFLEX): HIV 1&2 Ab, 4th Generation: NONREACTIVE

## 2018-12-07 LAB — HEPATITIS C ANTIBODY
Hepatitis C Ab: NONREACTIVE
SIGNAL TO CUT-OFF: 0.03 (ref <1.00)

## 2018-12-07 LAB — RPR: RPR Ser Ql: NONREACTIVE

## 2018-12-07 LAB — HEPATITIS B CORE ANTIBODY, TOTAL: Hep B Core Total Ab: NONREACTIVE

## 2018-12-10 ENCOUNTER — Telehealth: Payer: Self-pay

## 2018-12-10 NOTE — Telephone Encounter (Signed)
Pt called requesting an update on referral for sleep study. Pls advise, thanks!

## 2018-12-11 NOTE — Telephone Encounter (Signed)
They are working on English as a second language teacherinsurance authorization. - CF

## 2018-12-25 NOTE — Telephone Encounter (Signed)
Pt was informed of update on sleep study on 12/10/18.

## 2019-01-01 ENCOUNTER — Ambulatory Visit (HOSPITAL_BASED_OUTPATIENT_CLINIC_OR_DEPARTMENT_OTHER): Payer: 59 | Attending: Osteopathic Medicine | Admitting: Internal Medicine

## 2019-01-01 VITALS — Ht 67.0 in | Wt 265.0 lb

## 2019-01-01 DIAGNOSIS — G4733 Obstructive sleep apnea (adult) (pediatric): Secondary | ICD-10-CM

## 2019-01-01 DIAGNOSIS — R5383 Other fatigue: Secondary | ICD-10-CM | POA: Diagnosis not present

## 2019-01-01 DIAGNOSIS — R400 Somnolence: Secondary | ICD-10-CM | POA: Diagnosis present

## 2019-01-01 DIAGNOSIS — Z9189 Other specified personal risk factors, not elsewhere classified: Secondary | ICD-10-CM | POA: Insufficient documentation

## 2019-01-01 DIAGNOSIS — R0681 Apnea, not elsewhere classified: Secondary | ICD-10-CM | POA: Diagnosis not present

## 2019-01-09 DIAGNOSIS — G4733 Obstructive sleep apnea (adult) (pediatric): Secondary | ICD-10-CM | POA: Diagnosis not present

## 2019-01-09 NOTE — Procedures (Signed)
   Patient Name: Lonnie Cantrell, Lonnie Cantrell Date: 01/02/2019 Gender: Male D.O.B: April 14, 1986 Age (years): 32 Referring Provider: Sunnie Nielsen Height (inches): 67 Interpreting Physician: Jetty Duhamel MD, ABSM Weight (lbs): 265 RPSGT: [place] BMI: 42 MRN: [id number] Neck Size: 19.00  CLINICAL INFORMATION Sleep Study Type: HST Indication for sleep study: Fatigue Epworth Sleepiness Score: 6  SLEEP STUDY TECHNIQUE A multi-channel overnight portable sleep study was performed. The channels recorded were: nasal airflow, thoracic respiratory movement, and oxygen saturation with a pulse oximetry. Snoring was also monitored.  MEDICATIONS Patient self administered medications include: none reported.  SLEEP ARCHITECTURE Patient was studied for 366 minutes. The sleep efficiency was 98.3 % and the patient was supine for 84.9%. The arousal index was 0.0 per hour.  RESPIRATORY PARAMETERS The overall AHI was 81.0 per hour, with a central apnea index of 0.0 per hour. The oxygen nadir was 58% during sleep.  CARDIAC DATA Mean heart rate during sleep was 76.3 bpm.  IMPRESSIONS - Severe obstructive sleep apnea occurred during this study (AHI = 81.0/h). - No significant central sleep apnea occurred during this study (CAI = 0.0/h). - Severe oxygen desaturation was noted during this study (Min O2 = 58%). - Patient snored.  DIAGNOSIS - Obstructive Sleep Apnea (327.23 [G47.33 ICD-10]) - Nocturnal Hypoxemia (327.26 [G47.36 ICD-10])  RECOMMENDATIONS - Suggest CPAP/ BIPAP titration sleep study. Alternatively can try Autopap 10-20. Sleep medical consultation or ENT evaluation are available. - Be careful with alcohol, sedatives and other CNS depressants that may worsen sleep apnea and disrupt normal sleep architecture. - Sleep hygiene should be reviewed to assess factors that may improve sleep quality. - Weight management and regular exercise should be initiated or  continued.  [Electronically signed] 01/09/2019 11:08 AM  Jetty Duhamel MD, ABSM Diplomate, American Board of Sleep Medicine   NPI: [id number]                          Jetty Duhamel Diplomate, American Board of Sleep Medicine  ELECTRONICALLY SIGNED ON:  01/09/2019, 11:04 AM [facility] PH: [phone]   FX: [phone] ACCREDITED BY THE AMERICAN ACADEMY OF SLEEP MEDICINE

## 2019-01-12 ENCOUNTER — Encounter: Payer: Self-pay | Admitting: Osteopathic Medicine

## 2019-01-12 ENCOUNTER — Other Ambulatory Visit: Payer: Self-pay | Admitting: Osteopathic Medicine

## 2019-01-12 DIAGNOSIS — G4733 Obstructive sleep apnea (adult) (pediatric): Secondary | ICD-10-CM

## 2019-01-12 HISTORY — DX: Obstructive sleep apnea (adult) (pediatric): G47.33

## 2019-01-26 ENCOUNTER — Telehealth: Payer: Self-pay

## 2019-01-26 NOTE — Telephone Encounter (Signed)
Pt called requesting an update on order for CPAP machine for sleep apnea. Pls advise, thanks.

## 2019-01-27 ENCOUNTER — Telehealth: Payer: Self-pay

## 2019-01-27 DIAGNOSIS — G4733 Obstructive sleep apnea (adult) (pediatric): Secondary | ICD-10-CM

## 2019-01-27 NOTE — Telephone Encounter (Signed)
Ok I resent the referral with that in the comments

## 2019-01-27 NOTE — Telephone Encounter (Addendum)
Pt has been updated of provider's note and is aware of add'l sleep studies is required. Pt will contact [facility] to schedule an appt. No other inquiries during call.

## 2019-01-27 NOTE — Telephone Encounter (Signed)
Pt contacted Sleep center and was informed that provider will need to put in another referral for the add'l testing. If not, add'l studies will not be covered by pt's insurance. Pls advise, thanks.

## 2019-01-27 NOTE — Telephone Encounter (Signed)
Arline Asp can you check on this? I thought I HAD put in the appropriate referral, but maybe I'm missing something?  Based on his home sleep study done 01/01/19 he had severe sleep apnea: per that report "Suggest CPAP/ BIPAP titration sleep study... Sleep medical consultation or ENT evaluation are available."   Assuming he would see sleep specialist, I sent referral to [facility] on [address] 01/12/18.Marland KitchenMarland Kitchen

## 2019-01-27 NOTE — Telephone Encounter (Signed)
Referral was sent by [place] to sleep center 01/12/19, they should have called him to arrange follow-up as he needed additional testing. He should call [facility] at Phone: [phone] and/or check his voicemails

## 2019-01-27 NOTE — Telephone Encounter (Signed)
Lonnie Cantrell the order needs to be a sleep study with CPAP/BIPAP titration that is the additional testing they are talking about the way the order looks now it just shows a sleep test.

## 2019-02-10 ENCOUNTER — Telehealth: Payer: Self-pay

## 2019-02-10 DIAGNOSIS — G4733 Obstructive sleep apnea (adult) (pediatric): Secondary | ICD-10-CM

## 2019-02-10 NOTE — Telephone Encounter (Signed)
Sleep study at [facility] called stating that add'l sleep study is placed as a referral. As per nurse, must be done as an order. Pls advise. Thanks.

## 2019-02-10 NOTE — Telephone Encounter (Signed)
Pt called stating he has not heard back from [facility] Sleep study. Pt was informed an  updated referral sent on 01/27/19. Pt was told to contact office directly regarding PA for sleep study and/or appt. No other inquiries during call.

## 2019-02-10 NOTE — Telephone Encounter (Signed)
I think I placed the appropriate order, please have them suggest the exact order needed if there are any problems

## 2019-03-02 ENCOUNTER — Telehealth: Payer: Self-pay

## 2019-03-02 NOTE — Telephone Encounter (Signed)
As per Aurther Loft at [employer] - insurance denied add'l sleep study for patient. Provider will need to complete a peer to peer review for an appeal. Pls contact [phone]. Reference # [id number]. Aurther Loft can be contacted at [phone] if needed.

## 2019-03-04 NOTE — Telephone Encounter (Signed)
Noted  

## 2019-03-04 NOTE — Telephone Encounter (Signed)
Will work on this on admin time Friday

## 2019-03-05 NOTE — Telephone Encounter (Signed)
Tried to complete peer to peer review but the line kept breaking up.  Had to leave for the day, will try again on Monday.  Of note, I did review the sleep study results again, 01/01/2019.  I am not sure why insurance is denying this.  At the recommendation of the sleep specialist who interpreted the report, Dr Maple Hudson suggested "suggest CPAP/BiPAP titration sleep study."  Did mention "could alternatively try AutoPap 10-20" but I interpreted this as a second choice option.

## 2019-05-18 ENCOUNTER — Emergency Department (HOSPITAL_COMMUNITY): Payer: 59

## 2019-05-18 ENCOUNTER — Other Ambulatory Visit: Payer: Self-pay

## 2019-05-18 ENCOUNTER — Encounter (HOSPITAL_COMMUNITY): Payer: Self-pay

## 2019-05-18 ENCOUNTER — Emergency Department (HOSPITAL_COMMUNITY)
Admission: EM | Admit: 2019-05-18 | Discharge: 2019-05-18 | Disposition: A | Discharge status: Home / Self Care | Payer: 59 | Attending: Emergency Medicine | Admitting: Emergency Medicine

## 2019-05-18 DIAGNOSIS — S14109A Unspecified injury at unspecified level of cervical spinal cord, initial encounter: Secondary | ICD-10-CM | POA: Diagnosis present

## 2019-05-18 DIAGNOSIS — S12401A Unspecified nondisplaced fracture of fifth cervical vertebra, initial encounter for closed fracture: Secondary | ICD-10-CM | POA: Diagnosis not present

## 2019-05-18 DIAGNOSIS — M542 Cervicalgia: Secondary | ICD-10-CM

## 2019-05-18 DIAGNOSIS — Y9389 Activity, other specified: Secondary | ICD-10-CM | POA: Insufficient documentation

## 2019-05-18 DIAGNOSIS — Y998 Other external cause status: Secondary | ICD-10-CM | POA: Diagnosis not present

## 2019-05-18 DIAGNOSIS — Y92410 Unspecified street and highway as the place of occurrence of the external cause: Secondary | ICD-10-CM | POA: Diagnosis not present

## 2019-05-18 LAB — COMPREHENSIVE METABOLIC PANEL
ALT: 31 U/L (ref 0–44)
AST: 25 U/L (ref 15–41)
Albumin: 3.7 g/dL (ref 3.5–5.0)
Alkaline Phosphatase: 74 U/L (ref 38–126)
Anion gap: 6 (ref 5–15)
BUN: 11 mg/dL (ref 6–20)
CO2: 30 mmol/L (ref 22–32)
Calcium: 9.3 mg/dL (ref 8.9–10.3)
Chloride: 102 mmol/L (ref 98–111)
Creatinine, Ser: 1.13 mg/dL (ref 0.61–1.24)
GFR calc Af Amer: >60 mL/min (ref >60)
GFR calc non Af Amer: >60 mL/min (ref >60)
Glucose, Bld: 98 mg/dL (ref 70–99)
Potassium: 4.7 mmol/L (ref 3.5–5.1)
Sodium: 138 mmol/L (ref 135–145)
Total Bilirubin: 0.4 mg/dL (ref 0.3–1.2)
Total Protein: 7.3 g/dL (ref 6.5–8.1)

## 2019-05-18 LAB — CDS SEROLOGY

## 2019-05-18 LAB — URINALYSIS, ROUTINE W REFLEX MICROSCOPIC
Bilirubin Urine: NEGATIVE
Glucose, UA: NEGATIVE mg/dL
Hgb urine dipstick: NEGATIVE
Ketones, ur: NEGATIVE mg/dL
Leukocytes,Ua: NEGATIVE
Nitrite: NEGATIVE
Protein, ur: NEGATIVE mg/dL
Specific Gravity, Urine: 1.02 (ref 1.005–1.030)
pH: 5 (ref 5.0–8.0)

## 2019-05-18 LAB — ETHANOL: Alcohol, Ethyl (B): <10 mg/dL (ref <10)

## 2019-05-18 LAB — CBC
HCT: 45.8 % (ref 39.0–52.0)
Hemoglobin: 14.6 g/dL (ref 13.0–17.0)
MCH: 27.3 pg (ref 26.0–34.0)
MCHC: 31.9 g/dL (ref 30.0–36.0)
MCV: 85.8 fL (ref 80.0–100.0)
Platelets: 178 10*3/uL (ref 150–400)
RBC: 5.34 MIL/uL (ref 4.22–5.81)
RDW: 14.8 % (ref 11.5–15.5)
WBC: 11.7 10*3/uL — ABNORMAL HIGH (ref 4.0–10.5)
nRBC: 0 % (ref 0.0–0.2)

## 2019-05-18 LAB — PROTIME-INR
INR: 1 (ref 0.8–1.2)
Prothrombin Time: 13.4 seconds (ref 11.4–15.2)

## 2019-05-18 LAB — LACTIC ACID, PLASMA: Lactic Acid, Venous: 1.4 mmol/L (ref 0.5–1.9)

## 2019-05-18 LAB — SAMPLE TO BLOOD BANK

## 2019-05-18 MED ORDER — IOHEXOL 350 MG/ML SOLN
75.0000 mL | Freq: Once | INTRAVENOUS | Status: AC | PRN
  Administered 2019-05-18: 75 mL via INTRAVENOUS

## 2019-05-18 MED ORDER — METHOCARBAMOL 500 MG PO TABS: 500.0000 mg | ORAL_TABLET | Freq: Four times a day (QID) | ORAL | 0 refills | Status: AC | PRN

## 2019-05-18 MED ORDER — TETANUS-DIPHTH-ACELL PERTUSSIS 5-2.5-18.5 LF-MCG/0.5 IM SUSP
0.5000 mL | Freq: Once | INTRAMUSCULAR | Status: AC
Start: 2019-05-18 — End: 2019-05-18
  Administered 2019-05-18: 0.5 mL via INTRAMUSCULAR
  Filled 2019-05-18: qty 0.5

## 2019-05-18 MED ORDER — NAPROXEN 500 MG PO TABS: 500.0000 mg | ORAL_TABLET | Freq: Two times a day (BID) | ORAL | 0 refills | Status: AC

## 2019-05-18 MED ORDER — DIAZEPAM 5 MG PO TABS
5.0000 mg | ORAL_TABLET | Freq: Once | ORAL | Status: AC
  Administered 2019-05-18: 14:00:00 5 mg via ORAL
  Filled 2019-05-18: qty 1

## 2019-05-18 MED ORDER — METHOCARBAMOL 500 MG PO TABS: 500.0000 mg | ORAL_TABLET | Freq: Four times a day (QID) | ORAL | 0 refills | Status: DC | PRN

## 2019-05-18 MED ORDER — NAPROXEN 500 MG PO TABS: 500.0000 mg | ORAL_TABLET | Freq: Two times a day (BID) | ORAL | 0 refills | Status: DC

## 2019-05-18 NOTE — ED Notes (Signed)
Patient transported to CT scan . 

## 2019-05-18 NOTE — ED Notes (Signed)
Nurse Navigator communication: The patient has a cell phone at bedside he states he will call her. At this moment, IV and blood is being collected.

## 2019-05-18 NOTE — Discharge Instructions (Addendum)
I have prescribed muscle relaxers for your pain, please do not drink or drive while taking this medications as they can make you drowsy. Please follow-up with surgery.  Wear your hard collar at all times.  If you experience any bowel or bladder incontinence, fever, worsening in your symptoms please return to the ED.
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
## 2019-05-18 NOTE — ED Notes (Signed)
Nurse Navigator communication: The patient has a cell phone at bedside and is able to give updates to family.     

## 2019-06-01 ENCOUNTER — Telehealth: Payer: Self-pay | Admitting: Osteopathic Medicine

## 2019-06-01 DIAGNOSIS — G4733 Obstructive sleep apnea (adult) (pediatric): Secondary | ICD-10-CM

## 2019-06-01 NOTE — Telephone Encounter (Signed)
Pt would like another referral for sleep study.  Thanks

## 2019-06-03 ENCOUNTER — Encounter: Payer: 59 | Admitting: Osteopathic Medicine

## 2019-06-03 NOTE — Telephone Encounter (Signed)
Left a detailed vm msg for pt regarding referral for sleep study. Direct call back info provided.

## 2019-06-03 NOTE — Telephone Encounter (Signed)
Referral is in.

## 2019-06-06 HISTORY — PX: NECK SURGERY: SHX720

## 2019-06-29 ENCOUNTER — Encounter: Payer: 59 | Admitting: Osteopathic Medicine
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
## 2020-10-27 ENCOUNTER — Telehealth: Payer: Self-pay

## 2020-10-27 NOTE — Telephone Encounter (Signed)
Appointment has been made. No further questions at this time.  

## 2020-10-27 NOTE — Telephone Encounter (Signed)
Patient called and was wanting Tdap. He has not seen Dr Lyn Hollingshead since 12/02/18.   Please call patient and schedule for physical

## 2020-11-09 ENCOUNTER — Encounter: Payer: 59 | Admitting: Nurse Practitioner

## 2020-11-16 ENCOUNTER — Encounter: Payer: 59 | Admitting: Nurse Practitioner

## 2022-04-30 ENCOUNTER — Other Ambulatory Visit: Payer: Self-pay

## 2022-04-30 ENCOUNTER — Encounter (HOSPITAL_COMMUNITY): Payer: Self-pay

## 2022-04-30 ENCOUNTER — Emergency Department (HOSPITAL_COMMUNITY): Payer: 59

## 2022-04-30 ENCOUNTER — Emergency Department (HOSPITAL_COMMUNITY)
Admission: EM | Admit: 2022-04-30 | Discharge: 2022-05-01 | Disposition: A | Discharge status: Home / Self Care | Payer: 59 | Attending: Emergency Medicine | Admitting: Emergency Medicine

## 2022-04-30 DIAGNOSIS — L03115 Cellulitis of right lower limb: Secondary | ICD-10-CM | POA: Insufficient documentation

## 2022-04-30 DIAGNOSIS — M79604 Pain in right leg: Secondary | ICD-10-CM | POA: Diagnosis present

## 2022-04-30 LAB — CBC WITH DIFFERENTIAL/PLATELET
Abs Immature Granulocytes: 0.05 10*3/uL (ref 0.00–0.07)
Basophils Absolute: 0.1 10*3/uL (ref 0.0–0.1)
Basophils Relative: 0 %
Eosinophils Absolute: 0.3 10*3/uL (ref 0.0–0.5)
Eosinophils Relative: 2 %
HCT: 43.6 % (ref 39.0–52.0)
Hemoglobin: 13.9 g/dL (ref 13.0–17.0)
Immature Granulocytes: 0 %
Lymphocytes Relative: 17 %
Lymphs Abs: 2.5 10*3/uL (ref 0.7–4.0)
MCH: 27.4 pg (ref 26.0–34.0)
MCHC: 31.9 g/dL (ref 30.0–36.0)
MCV: 86 fL (ref 80.0–100.0)
Monocytes Absolute: 0.9 10*3/uL (ref 0.1–1.0)
Monocytes Relative: 6 %
Neutro Abs: 11.2 10*3/uL — ABNORMAL HIGH (ref 1.7–7.7)
Neutrophils Relative %: 75 %
Platelets: 182 10*3/uL (ref 150–400)
RBC: 5.07 MIL/uL (ref 4.22–5.81)
RDW: 14.4 % (ref 11.5–15.5)
WBC: 15 10*3/uL — ABNORMAL HIGH (ref 4.0–10.5)
nRBC: 0 % (ref 0.0–0.2)

## 2022-04-30 LAB — COMPREHENSIVE METABOLIC PANEL
ALT: 51 U/L — ABNORMAL HIGH (ref 0–44)
AST: 25 U/L (ref 15–41)
Albumin: 3.5 g/dL (ref 3.5–5.0)
Alkaline Phosphatase: 64 U/L (ref 38–126)
Anion gap: 6 (ref 5–15)
BUN: 13 mg/dL (ref 6–20)
CO2: 23 mmol/L (ref 22–32)
Calcium: 8.5 mg/dL — ABNORMAL LOW (ref 8.9–10.3)
Chloride: 109 mmol/L (ref 98–111)
Creatinine, Ser: 0.91 mg/dL (ref 0.61–1.24)
GFR, Estimated: >60 mL/min (ref >60)
Glucose, Bld: 96 mg/dL (ref 70–99)
Potassium: 3.7 mmol/L (ref 3.5–5.1)
Sodium: 138 mmol/L (ref 135–145)
Total Bilirubin: 0.3 mg/dL (ref 0.3–1.2)
Total Protein: 7.8 g/dL (ref 6.5–8.1)

## 2022-04-30 LAB — PROTIME-INR
INR: 1.1 (ref 0.8–1.2)
Prothrombin Time: 13.7 seconds (ref 11.4–15.2)

## 2022-04-30 LAB — D-DIMER, QUANTITATIVE: D-Dimer, Quant: 0.53 ug/mL-FEU — ABNORMAL HIGH (ref 0.00–0.50)

## 2022-04-30 LAB — LACTIC ACID, PLASMA: Lactic Acid, Venous: 0.9 mmol/L (ref 0.5–1.9)

## 2022-04-30 LAB — SEDIMENTATION RATE: Sed Rate: 82 mm/hr — ABNORMAL HIGH (ref 0–16)

## 2022-04-30 MED ORDER — HYDROMORPHONE HCL 1 MG/ML IJ SOLN
1.0000 mg | Freq: Once | INTRAMUSCULAR | Status: AC
  Administered 2022-04-30: 1 mg via INTRAVENOUS
  Filled 2022-04-30: qty 1

## 2022-04-30 MED ORDER — LACTATED RINGERS IV BOLUS (SEPSIS)
1000.0000 mL | Freq: Once | INTRAVENOUS | Status: AC
  Administered 2022-04-30: 1000 mL via INTRAVENOUS

## 2022-04-30 NOTE — Discharge Instructions (Signed)
Continue to take your antibiotics as prescribed.  Continue ibuprofen and Tylenol for pain.  Elevate leg when possible.  Return to the emergency department if you experience any worsening symptoms despite antibiotics. ?

## 2022-04-30 NOTE — ED Triage Notes (Signed)
Pt was seen Monday at [facility] for fever and left leg pain. Pt presents today with leg swelling that started Monday after he left. Left leg is swollen, red and warm to the touch. Pt endorses intense pain.  ?

## 2022-04-30 NOTE — ED Provider Notes (Signed)
?[place] EMERGENCY DEPARTMENT ?Provider Note ? ? ?CSN: [id number] ?Arrival date & time: 04/30/22  1853 ? ?  ? ?History ? ?Chief Complaint  ?Patient presents with  ? Leg Swelling  ? ? ?Lonnie Cantrell. is a 36 y.o. male. ? ?HPI ?Patient presents for right leg pain and swelling.  Recent history is as follows: On Sunday, he experienced a discomfort in the area of his right groin.  He went to [facility] emergency room where they did lab work and imaging studies.  He states that his WBC was 33,000 at that time.  He had a fever of 103 degrees.  He was started on clindamycin and has been taking that for the past 2 days, 300 mg every 6.  He stayed in the ER through most of the night.  Since yesterday, he has experienced increased pain, swelling, and redness in his distal right leg.  Medical history includes obesity and sleep apnea.  He has only minimal pain in his upper leg at this time.  He has severe pain in his distal leg.  He denies any recent injuries.  He denies any recent wounds to the area.  In addition to the clindamycin, he has been alternating ibuprofen and Tylenol. ?  ? ?Home Medications ?Prior to Admission medications   ?Medication Sig Start Date End Date Taking? Authorizing Provider  ?acetaminophen (TYLENOL) 500 MG tablet Take 1,000 mg by mouth every 6 (six) hours as needed.   Yes [provider]  ?Cetirizine HCl (ZYRTEC ALLERGY PO) Take 10 mg by mouth daily.   Yes [provider]  ?clindamycin (CLEOCIN) 300 MG capsule Take 300 mg by mouth every 6 (six) hours. 04/29/22  Yes [provider]  ?guaiFENesin (MUCINEX) 600 MG 12 hr tablet Take 600 mg by mouth 2 (two) times daily.   Yes [provider]  ?ibuprofen (ADVIL) 800 MG tablet Take 800 mg by mouth every 8 (eight) hours as needed.   Yes [provider]  ?   ? ?Allergies    ?Patient has no known allergies.   ? ?Review of Systems   ?Review of Systems  ?Constitutional:  Positive for chills and fever.   ?Musculoskeletal:  Positive for myalgias.  ?Skin:  Positive for color change.  ?All other systems reviewed and are negative. ? ?Physical Exam ?Updated Vital Signs ?BP 110/62   Pulse 79   Temp 98.2 ?F (36.8 ?C) (Oral)   Resp (!) 22   Ht 5\' 7"  (1.702 m)   Wt 120.2 kg   SpO2 96%   BMI 41.50 kg/m?  ?Physical Exam ?Vitals and nursing note reviewed.  ?Constitutional:   ?   General: He is not in acute distress. ?   Appearance: Normal appearance. He is well-developed. He is not ill-appearing, toxic-appearing or diaphoretic.  ?HENT:  ?   Head: Normocephalic and atraumatic.  ?   Right Ear: External ear normal.  ?   Left Ear: External ear normal.  ?   Nose: Nose normal.  ?   Mouth/Throat:  ?   Mouth: Mucous membranes are moist.  ?   Pharynx: Oropharynx is clear.  ?Eyes:  ?   Extraocular Movements: Extraocular movements intact.  ?   Conjunctiva/sclera: Conjunctivae normal.  ?Cardiovascular:  ?   Rate and Rhythm: Normal rate and regular rhythm.  ?   Heart sounds: No murmur heard. ?Pulmonary:  ?   Effort: Pulmonary effort is normal. No respiratory distress.  ?Abdominal:  ?   Palpations: Abdomen is soft.  ?  Tenderness: There is no abdominal tenderness.  ?Musculoskeletal:     ?   General: Swelling and tenderness present. Normal range of motion.  ?   Cervical back: Normal range of motion and neck supple. No rigidity.  ?Skin: ?   General: Skin is warm and dry.  ?   Capillary Refill: Capillary refill takes less than 2 seconds.  ?   Findings: Erythema present.  ?Neurological:  ?   General: No focal deficit present.  ?   Mental Status: He is alert and oriented to person, place, and time.  ?   Cranial Nerves: No cranial nerve deficit.  ?   Sensory: No sensory deficit.  ?   Motor: No weakness.  ?   Coordination: Coordination normal.  ?Psychiatric:     ?   Mood and Affect: Mood normal.     ?   Behavior: Behavior normal.     ?   Thought Content: Thought content normal.     ?   Judgment: Judgment normal.  ? ? ? ?ED Results /  Procedures / Treatments   ?Labs ?(all labs ordered are listed, but only abnormal results are displayed) ?Labs Reviewed  ?COMPREHENSIVE METABOLIC PANEL - Abnormal; Notable for the following components:  ?    Result Value  ? Calcium 8.5 (*)   ? ALT 51 (*)   ? All other components within normal limits  ?CBC WITH DIFFERENTIAL/PLATELET - Abnormal; Notable for the following components:  ? WBC 15.0 (*)   ? Neutro Abs 11.2 (*)   ? All other components within normal limits  ?SEDIMENTATION RATE - Abnormal; Notable for the following components:  ? Sed Rate 82 (*)   ? All other components within normal limits  ?C-REACTIVE PROTEIN - Abnormal; Notable for the following components:  ? CRP 18.2 (*)   ? All other components within normal limits  ?D-DIMER, QUANTITATIVE - Abnormal; Notable for the following components:  ? D-Dimer, Quant 0.53 (*)   ? All other components within normal limits  ?CULTURE, BLOOD (ROUTINE X 2)  ?CULTURE, BLOOD (ROUTINE X 2)  ?LACTIC ACID, PLASMA  ?PROTIME-INR  ? ? ?EKG ?EKG Interpretation ? ?Date/Time:  Tuesday Apr 30 2022 22:09:52 EDT ?Ventricular Rate:  86 ?PR Interval:  159 ?QRS Duration: 88 ?QT Interval:  361 ?QTC Calculation: 432 ?R Axis:   49 ?Text Interpretation: Sinus rhythm Low voltage, precordial leads Confirmed by Norman Clay ([id number]) on 05/01/2022 10:25:08 PM ? ?Radiology ?No results found. ? ?Procedures ?Procedures  ? ? ?Medications Ordered in ED ?Medications  ?lactated ringers bolus 1,000 mL (0 mLs Intravenous Stopped 04/30/22 2232)  ?HYDROmorphone (DILAUDID) injection 1 mg (1 mg Intravenous Given 04/30/22 2204)  ? ? ?ED Course/ Medical Decision Making/ A&P ?  ?                        ?Medical Decision Making ?Amount and/or Complexity of Data Reviewed ?Labs: ordered. ?Radiology: ordered. ?ECG/medicine tests: ordered. ? ?Risk ?Prescription drug management. ? ? ?This patient presents to the ED for concern of right lower extremity pain, this involves an extensive number of treatment options, and is a  complaint that carries with it a high risk of complications and morbidity.  The differential diagnosis includes cellulitis, DVT, venous insufficiency, insect bite ? ? ?Co morbidities that complicate the patient evaluation ? ?Obesity, OSA ? ? ?Additional history obtained: ? ?Additional history obtained from patient's wife ?External records from outside source obtained and reviewed including EMR ? ? ?  Lab Tests: ? ?I Ordered, and personally interpreted labs.  The pertinent results include: Leukocytosis (improved from 2 days ago), reassuring D-dimer, normal lactate, normal electrolytes ? ? ?Imaging Studies ordered: ? ?I ordered imaging studies including right tib-fib x-ray ?I independently visualized and interpreted imaging which showed soft tissue swelling only ?I agree with the radiologist interpretation ? ? ?Cardiac Monitoring: / EKG: ? ?The patient was maintained on a cardiac monitor.  I personally viewed and interpreted the cardiac monitored which showed an underlying rhythm of: Sinus rhythm ? ?Problem List / ED Course / Critical interventions / Medication management ? ?Patient is a 36 year old male who presents for pain, redness, and swelling to his anterior distal right lower extremity.  His recent history is as follows: 3 days ago, patient developed fevers, fatigue, groin pain, flank pain, nausea.  He was seen at outside hospital ED where he underwent lab work and CT scan of abdomen and pelvis.  He was given IV fluids and discharged with clindamycin.  He has been taking clindamycin since his recent discharge from the ED.  He reports resolution of most of his previous symptoms.  He has, however, developed an area of redness, pain, and swelling to his anterior, distal RLE.  This developed over the past 24 hours.  Pain is worsened when upright and walking.  His wife reports that the area of swelling has decreased throughout the evening.  He denies any recent injuries to the area.  On arrival in the ED, patient is  afebrile.  His vital signs are notable for mild tachypnea.  He is well-appearing on exam.  Area of concern on his distal RLE is erythematous, swollen, warm, and tender.  Findings are consistent with cellulitis.  There are no areas of

## 2022-05-01 LAB — C-REACTIVE PROTEIN: CRP: 18.2 mg/dL — ABNORMAL HIGH (ref <1.0)

## 2022-05-01 NOTE — ED Notes (Signed)
Patient verbalizes understanding of discharge instructions. Opportunity for questioning and answers were provided. Armband removed by staff, pt discharged from ED. Ambulated out to lobby with wife  

## 2022-05-05 LAB — CULTURE, BLOOD (ROUTINE X 2)
Culture: NO GROWTH
Culture: NO GROWTH
Special Requests: ADEQUATE

## 2022-05-08 ENCOUNTER — Encounter: Payer: Self-pay | Admitting: Nurse Practitioner

## 2022-05-08 ENCOUNTER — Ambulatory Visit: Payer: 59 | Admitting: Nurse Practitioner

## 2022-05-08 VITALS — BP 103/71 | HR 74 | Temp 98.4°F | Resp 20 | Ht 67.0 in | Wt 264.0 lb

## 2022-05-08 DIAGNOSIS — L03115 Cellulitis of right lower limb: Secondary | ICD-10-CM | POA: Diagnosis not present

## 2022-05-08 MED ORDER — CEFTRIAXONE SODIUM 1 G IJ SOLR
1.0000 g | Freq: Once | INTRAMUSCULAR | Status: AC
  Administered 2022-05-08: 1 g via INTRAMUSCULAR

## 2022-05-08 NOTE — Patient Instructions (Signed)
Cellulitis, Adult  Cellulitis is a skin infection. The infected area is often warm, red, swollen, and sore. It occurs most often in the arms and lower legs. It is very important to get treated for this condition. What are the causes? This condition is caused by bacteria. The bacteria enter through a break in the skin, such as a cut, burn, insect bite, open sore, or crack. What increases the risk? This condition is more likely to occur in people who: Have a weak body defense system (immune system). Have open cuts, burns, bites, or scrapes on the skin. Are older than 36 years of age. Have a blood sugar problem (diabetes). Have a long-lasting (chronic) liver disease (cirrhosis) or kidney disease. Are very overweight (obese). Have a skin problem, such as: Itchy rash (eczema). Slow movement of blood in the veins (venous stasis). Fluid buildup below the skin (edema). Have been treated with high-energy rays (radiation). Use IV drugs. What are the signs or symptoms? Symptoms of this condition include: Skin that is: Red. Streaking. Spotting. Swollen. Sore or painful when you touch it. Warm. A fever. Chills. Blisters. How is this diagnosed? This condition is diagnosed based on: Medical history. Physical exam. Blood tests. Imaging tests. How is this treated? Treatment for this condition may include: Medicines to treat infections or allergies. Home care, such as: Rest. Placing cold or warm cloths (compresses) on the skin. Hospital care, if the condition is very bad. Follow these instructions at home: Medicines Take over-the-counter and prescription medicines only as told by your doctor. If you were prescribed an antibiotic medicine, take it as told by your doctor. Do not stop taking it even if you start to feel better. General instructions  Drink enough fluid to keep your pee (urine) pale yellow. Do not touch or rub the infected area. Raise (elevate) the infected area above  the level of your heart while you are sitting or lying down. Place cold or warm cloths on the area as told by your doctor. Keep all follow-up visits as told by your doctor. This is important. Contact a doctor if: You have a fever. You do not start to get better after 1-2 days of treatment. Your bone or joint under the infected area starts to hurt after the skin has healed. Your infection comes back. This can happen in the same area or another area. You have a swollen bump in the area. You have new symptoms. You feel ill and have muscle aches and pains. Get help right away if: Your symptoms get worse. You feel very sleepy. You throw up (vomit) or have watery poop (diarrhea) for a long time. You see red streaks coming from the area. Your red area gets larger. Your red area turns dark in color. These symptoms may represent a serious problem that is an emergency. Do not wait to see if the symptoms will go away. Get medical help right away. Call your local emergency services (911 in the [place] Do not drive yourself to the hospital. Summary Cellulitis is a skin infection. The area is often warm, red, swollen, and sore. This condition is treated with medicines, rest, and cold and warm cloths. Take all medicines only as told by your doctor. Tell your doctor if symptoms do not start to get better after 1-2 days of treatment. This information is not intended to replace advice given to you by your health care provider. Make sure you discuss any questions you have with your health care provider. Document Revised: 09/27/2021 Document   Reviewed: 09/27/2021 Elsevier Patient Education  2023 Elsevier Inc.  

## 2022-05-08 NOTE — Progress Notes (Addendum)
? ?New Patient Note ? ?RE: Lonnie Cantrell. MRN: [id number] DOB: [birth year] ?Date of Office Visit: 05/08/2022 ? ?Chief Complaint: Establish Care (New /) ? ?History of Present Illness: ?Patient is a 36 year old male who presents to to establish care with complaints of cellulitis.  ?Krikor who presents with erythema and tenderness.  ?Location: Right lower leg  ?Onset: acute  ?Duration: 7 days and symptoms are improving  ?Associated symptoms: Tenderness and discoloration  ?Recent treatment: antibiotics Cleocin ?Functional status affected: no ? ? ?Allergies: Patient has no known allergies. ?Patient Active Problem List  ? Diagnosis Date Noted  ? Cellulitis of right leg 05/13/2022  ? Severe obstructive sleep apnea 01/12/2019  ? Class 3 severe obesity due to excess calories without serious comorbidity with body mass index (BMI) of 40.0 to 44.9 in adult [facility]) 12/02/2018  ? At risk for obstructive sleep apnea 12/02/2018  ? Hair loss 12/02/2018  ? ?LESION DESCRIPTION: erythema  ?ASSOCIATED SIGNS: none and edema  ?SYSTEMIC SYMPTOMS: None  ?PULSES: peripheral pulses symmetrical and capillary refill normal  ?CAPILLARY REFILL: Normal  ? ?Assessment and Plan: ?Brinden is a 36 y.o. male with: ?Cellulitis of right leg ?Completed assessment of right leg.  Tylenol/ibuprofen for pain as needed, 1 g Rocephin shot given in clinic.  Patient completed 7 days of antibiotics prior to this visit.  Provided education to patient printed handouts given.  Patient knows to assess for worsening signs and symptoms of infection and knows to follow-up. ? ?Return if symptoms worsen or fail to improve. ? ? ?Diagnostics: ? ? ?Past Medical History: ?Patient Active Problem List  ? Diagnosis Date Noted  ? Cellulitis of right leg 05/13/2022  ? Severe obstructive sleep apnea 01/12/2019  ? Class 3 severe obesity due to excess calories without serious comorbidity with body mass index (BMI) of 40.0 to 44.9 in adult [facility]) 12/02/2018  ? At risk for obstructive  sleep apnea 12/02/2018  ? Hair loss 12/02/2018  ? ?Past Medical History:  ?Diagnosis Date  ? Severe obstructive sleep apnea 01/12/2019  ? ?Past Surgical History: ?Past Surgical History:  ?Procedure Laterality Date  ? NECK SURGERY  06/06/2019  ? C4 vertabre broke  ? ?Medication List:  ?Current Outpatient Medications  ?Medication Sig Dispense Refill  ? Cetirizine HCl (ZYRTEC ALLERGY PO) Take 10 mg by mouth daily. (Patient not taking: Reported on 05/08/2022)    ? guaiFENesin (MUCINEX) 600 MG 12 hr tablet Take 600 mg by mouth 2 (two) times daily. (Patient not taking: Reported on 05/08/2022)    ? ?No current facility-administered medications for this visit.  ? ?Allergies: ?No Known Allergies ?Social History: ?Social History  ? ?Socioeconomic History  ? Marital status: Single  ?  Spouse name: Not on file  ? Number of children: Not on file  ? Years of education: Not on file  ? Highest education level: Not on file  ?Occupational History  ? Not on file  ?Tobacco Use  ? Smoking status: Never  ? Smokeless tobacco: Never  ?Vaping Use  ? Vaping Use: Never used  ?Substance and Sexual Activity  ? Alcohol use: Not Currently  ? Drug use: Never  ? Sexual activity: Yes  ?  Birth control/protection: Condom  ?Other Topics Concern  ? Not on file  ?Social History Narrative  ? Not on file  ? ?Social Determinants of Health  ? ?Financial Resource Strain: Not on file  ?Food Insecurity: Not on file  ?Transportation Needs: Not on file  ?Physical Activity: Not on file  ?Stress:  Not on file  ?Social Connections: Not on file  ? ? ? ? ? ?Family History: ?Family History  ?Problem Relation Age of Onset  ? Pancreatic cancer Mother   ? Drug abuse Father   ? Aneurysm Sister   ?     brain  ? Sleep apnea Sister   ? ADD / ADHD Daughter   ? ODD Daughter   ? Other Son   ?     sensory processing disorder  ? Snoring Son   ? Ovarian cancer Paternal Grandmother   ? ?     ? ?Review of Systems  ?Constitutional: Negative.   ?HENT: Negative.    ?Eyes: Negative.    ?Respiratory: Negative.    ?Cardiovascular: Negative.   ?Gastrointestinal: Negative.   ?Genitourinary: Negative.   ?All other systems reviewed and are negative. ?Objective: ?BP 103/71   Pulse 74   Temp 98.4 ?F (36.9 ?C)   Resp 20   Ht 5\' 7"  (1.702 m)   Wt 264 lb (119.7 kg)   SpO2 93%   BMI 41.35 kg/m?  ?Body mass index is 41.35 kg/m? ?Physical Exam ?Vitals and nursing note reviewed.  ?Constitutional:   ?   Appearance: He is obese.  ?HENT:  ?   Head: Normocephalic.  ?   Right Ear: External ear normal.  ?   Left Ear: External ear normal.  ?   Nose: Nose normal.  ?   Mouth/Throat:  ?   Mouth: Mucous membranes are moist.  ?Eyes:  ?   Conjunctiva/sclera: Conjunctivae normal.  ?Cardiovascular:  ?   Rate and Rhythm: Normal rate and regular rhythm.  ?   Pulses: Normal pulses.  ?   Heart sounds: Normal heart sounds.  ?Abdominal:  ?   General: Bowel sounds are normal.  ?Musculoskeletal:     ?   General: Normal range of motion.  ?Skin: ?   Findings: Erythema present.  ? ?    ?   Comments: Cellulitis right lower leg  ?Neurological:  ?   Mental Status: He is alert and oriented to person, place, and time.  ? ?The plan was reviewed with the patient/family, and all questions/concerned were addressed. ? ?It was my pleasure to see Saud today and participate in his care. Please feel free to contact me with any questions or concerns. ? ?Sincerely, ? ?Andrey Campanile NP ?[facility] ?

## 2022-05-09 LAB — CBC WITH DIFFERENTIAL/PLATELET
Basophils Absolute: 0.1 10*3/uL (ref 0.0–0.2)
Basos: 1 %
EOS (ABSOLUTE): 0.2 10*3/uL (ref 0.0–0.4)
Eos: 2 %
Hematocrit: 45.1 % (ref 37.5–51.0)
Hemoglobin: 14.7 g/dL (ref 13.0–17.7)
Immature Grans (Abs): 0 10*3/uL (ref 0.0–0.1)
Immature Granulocytes: 0 %
Lymphocytes Absolute: 2.9 10*3/uL (ref 0.7–3.1)
Lymphs: 24 %
MCH: 27.5 pg (ref 26.6–33.0)
MCHC: 32.6 g/dL (ref 31.5–35.7)
MCV: 85 fL (ref 79–97)
Monocytes Absolute: 0.7 10*3/uL (ref 0.1–0.9)
Monocytes: 6 %
Neutrophils Absolute: 8.3 10*3/uL — ABNORMAL HIGH (ref 1.4–7.0)
Neutrophils: 67 %
Platelets: 348 10*3/uL (ref 150–450)
RBC: 5.34 x10E6/uL (ref 4.14–5.80)
RDW: 13.5 % (ref 11.6–15.4)
WBC: 12.3 10*3/uL — ABNORMAL HIGH (ref 3.4–10.8)

## 2022-05-13 DIAGNOSIS — L03115 Cellulitis of right lower limb: Secondary | ICD-10-CM | POA: Insufficient documentation

## 2022-05-13 NOTE — Assessment & Plan Note (Signed)
Completed assessment of right leg.  Tylenol/ibuprofen for pain as needed, 1 g Rocephin shot given in clinic.  Patient completed 7 days of antibiotics prior to this visit.  Provided education to patient printed handouts given.  Patient knows to assess for worsening signs and symptoms of infection and knows to follow-up. ?

## 2022-05-21 ENCOUNTER — Encounter: Payer: Self-pay | Admitting: Nurse Practitioner

## 2022-05-21 ENCOUNTER — Ambulatory Visit: Payer: 59 | Admitting: Nurse Practitioner

## 2022-05-21 VITALS — BP 128/76 | HR 72 | Temp 97.7°F | Ht 66.0 in | Wt 276.4 lb

## 2022-05-21 DIAGNOSIS — L03115 Cellulitis of right lower limb: Secondary | ICD-10-CM

## 2022-05-21 NOTE — Progress Notes (Signed)
   Acute Office Visit  Subjective:     Patient ID: Lonnie Arth., male    DOB: 09-Nov-1986, 36 y.o.   MRN: [id number]  Chief Complaint  Patient presents with   Follow-up    Cellulitis    HPI Patient is in today for Reviewed intake note. SUBJECTIVE: Lonnie Cantrell is a 36 y.o. male who presents with tenderness.  Location: right lower leg  Onset: resolved  Duration: 2 weeks and symptoms have resolved  Associated symptoms: none  Recent treatment: none Functional status affected: no   Allergies: Patient has no known allergies. Patient Active Problem List   Diagnosis Date Noted   Cellulitis of right leg 05/13/2022   Severe obstructive sleep apnea 01/12/2019   Class 3 severe obesity due to excess calories without serious comorbidity with body mass index (BMI) of 40.0 to 44.9 in adult [facility]) 12/02/2018   At risk for obstructive sleep apnea 12/02/2018   Hair loss 12/02/2018     Review of Systems  Constitutional: Negative.   HENT: Negative.  Negative for hearing loss.   Eyes: Negative.   Respiratory: Negative.    Cardiovascular: Negative.   Genitourinary: Negative.   Skin: Negative.  Negative for rash.  Neurological: Negative.   All other systems reviewed and are negative.      Objective:    BP 128/76   Pulse 72   Temp 97.7 F (36.5 C)   Ht 5\' 6"  (1.676 m)   Wt 276 lb 6.4 oz (125.4 kg)   SpO2 94%   BMI 44.61 kg/m  BP Readings from Last 3 Encounters:  05/21/22 128/76  05/08/22 103/71  05/01/22 110/62   Wt Readings from Last 3 Encounters:  05/21/22 276 lb 6.4 oz (125.4 kg)  05/08/22 264 lb (119.7 kg)  04/30/22 265 lb (120.2 kg)      Physical Exam Vitals and nursing note reviewed.  Constitutional:      Appearance: Normal appearance.  HENT:     Head: Normocephalic.     Right Ear: External ear normal.     Left Ear: External ear normal.     Nose: Nose normal.     Mouth/Throat:     Mouth: Mucous membranes are moist.     Pharynx: Oropharynx is clear.   Eyes:     Conjunctiva/sclera: Conjunctivae normal.  Cardiovascular:     Rate and Rhythm: Normal rate and regular rhythm.     Pulses: Normal pulses.     Heart sounds: Normal heart sounds.  Pulmonary:     Effort: Pulmonary effort is normal.     Breath sounds: Normal breath sounds.  Abdominal:     General: Bowel sounds are normal.  Skin:    General: Skin is warm.     Findings: No rash.  Neurological:     Mental Status: He is alert and oriented to person, place, and time.    No results found for any visits on 05/21/22.      Assessment & Plan:  Patient following up for cellulites, symptoms resolved with no worsening signs or symptoms of infection. Patient knows to follow up.  Problem List Items Addressed This Visit   None   No orders of the defined types were placed in this encounter.   No follow-ups on file.  05/23/22, NP

## 2022-10-02 ENCOUNTER — Encounter: Payer: Self-pay | Admitting: Nurse Practitioner

## 2022-10-02 ENCOUNTER — Ambulatory Visit: Payer: 59 | Admitting: Nurse Practitioner

## 2022-12-13 ENCOUNTER — Encounter: Payer: Self-pay | Admitting: Family Medicine

## 2022-12-13 ENCOUNTER — Ambulatory Visit: Payer: 59 | Admitting: Family Medicine

## 2022-12-13 VITALS — BP 117/61 | HR 75 | Temp 97.2°F | Ht 66.0 in | Wt 258.4 lb

## 2022-12-13 DIAGNOSIS — Z113 Encounter for screening for infections with a predominantly sexual mode of transmission: Secondary | ICD-10-CM | POA: Diagnosis not present

## 2022-12-13 NOTE — Progress Notes (Signed)
   Acute Office Visit  Subjective:     Patient ID: Lonnie Illescas., male    DOB: May 03, 1986, 36 y.o.   MRN: [id number]  Chief Complaint  Patient presents with   std screening    HPI Patient is in today for STD screening. He denies known exposure or symptom. He is sexually active and would just like to have screening done.   ROS As per HPI.      Objective:    BP 117/61   Pulse 75   Temp (!) 97.2 F (36.2 C) (Temporal)   Ht 5\' 6"  (1.676 m)   Wt 258 lb 6 oz (117.2 kg)   SpO2 96%   BMI 41.70 kg/m    Physical Exam Vitals and nursing note reviewed.  Constitutional:      General: He is not in acute distress.    Appearance: He is not ill-appearing, toxic-appearing or diaphoretic.  Cardiovascular:     Rate and Rhythm: Normal rate and regular rhythm.     Heart sounds: Normal heart sounds. No murmur heard. Pulmonary:     Effort: Pulmonary effort is normal. No respiratory distress.     Breath sounds: Normal breath sounds.  Skin:    General: Skin is warm and dry.  Neurological:     General: No focal deficit present.     Mental Status: He is alert and oriented to person, place, and time.  Psychiatric:        Mood and Affect: Mood normal.        Behavior: Behavior normal.        Thought Content: Thought content normal.        Judgment: Judgment normal.     No results found for any visits on 12/13/22.      Assessment & Plan:   Lonnie Cantrell was seen today for std screening.  Diagnoses and all orders for this visit:  Screening for STDs (sexually transmitted diseases) Screening labs as below. Denies known exposure or symptoms. Safe sex handout given.  -     HepB+HepC+HIV Panel -     RPR -     HSV(herpes simplex vrs) 1+2 ab-IgG -     GC/Chlamydia Probe Amp  Return for schedule CPE.  The patient indicates understanding of these issues and agrees with the plan.  Andrey Campanile, FNP

## 2022-12-13 NOTE — Addendum Note (Signed)
Addended by: Hessie Diener on: 12/13/2022 04:44 PM   Modules accepted: Orders

## 2022-12-13 NOTE — Patient Instructions (Signed)
Safe Sex Practicing safe sex means taking steps before and during sex to reduce your risk of: Getting an STI (sexually transmitted infection). Giving your partner an STI. Unwanted or unplanned pregnancy. How to practice safe sex Ways you can practice safe sex  Limit your sexual partners to only one partner who is having sex with only you. Avoid using alcohol and drugs before having sex. Alcohol and drugs can affect your judgment. Before having sex with a new partner: Talk to your partner about past partners, past STIs, and drug use. Get screened for STIs and discuss the results with your partner. Ask your partner to get screened too. Check your body regularly for sores, blisters, rashes, or unusual discharge. If you notice any of these problems, visit your health care provider. Avoid sexual contact if you have symptoms of an infection or you are being treated for an STI. While having sex, use a condom. Make sure to: Use a condom every time you have vaginal, oral, or anal sex. Both females and males should wear condoms during oral sex. Keep condoms in place from the beginning to the end of sexual activity. Use a latex condom, if possible. Latex condoms offer the best protection. Use only water-based lubricants with a condom. Using petroleum-based lubricants or oils will weaken the condom and increase the chance that it will break. Ways your health care provider can help you practice safe sex  See your health care provider for regular screenings, exams, and tests for STIs. Talk with your health care provider about what kind of birth control (contraception) is best for you. Get vaccinated against hepatitis B and human papillomavirus (HPV). If you are at risk of being infected with HIV (human immunodeficiency virus), talk with your health care provider about taking a prescription medicine to prevent HIV infection. You are at risk for HIV if you: Are a man who has sex with other men. Are  sexually active with more than one partner. Take drugs by injection. Have a sex partner who has HIV. Have unprotected sex. Have sex with someone who has sex with both men and women. Have had an STI. Follow these instructions at home: Take over-the-counter and prescription medicines only as told by your health care provider. Keep all follow-up visits. This is important. Where to find more information Centers for Disease Control and Prevention: www.cdc.gov Planned Parenthood: www.plannedparenthood.org Office on Women's Health: www.womenshealth.gov Summary Practicing safe sex means taking steps before and during sex to reduce your risk getting an STI, giving your partner an STI, and having an unwanted or unplanned pregnancy. Before having sex with a new partner, talk to your partner about past partners, past STIs, and drug use. Use a condom every time you have vaginal, oral, or anal sex. Both females and males should wear condoms during oral sex. Check your body regularly for sores, blisters, rashes, or unusual discharge. If you notice any of these problems, visit your health care provider. See your health care provider for regular screenings, exams, and tests for STIs. This information is not intended to replace advice given to you by your health care provider. Make sure you discuss any questions you have with your health care provider. Document Revised: 05/22/2020 Document Reviewed: 05/22/2020 Elsevier Patient Education  2023 [employer]  

## 2022-12-14 LAB — HSV-2 AB, IGG: HSV 2 IgG, Type Spec: <0.91 index (ref 0.00–0.90)

## 2022-12-16 LAB — HEPB+HEPC+HIV PANEL
HIV Screen 4th Generation wRfx: NONREACTIVE
Hep B C IgM: NEGATIVE
Hep B Core Total Ab: NEGATIVE
Hep B E Ab: NEGATIVE
Hep B E Ag: NEGATIVE
Hep B Surface Ab, Qual: REACTIVE
Hep C Virus Ab: NONREACTIVE
Hepatitis B Surface Ag: NEGATIVE

## 2022-12-16 LAB — RPR: RPR Ser Ql: NONREACTIVE

## 2022-12-17 LAB — GC/CHLAMYDIA PROBE AMP
Chlamydia trachomatis, NAA: NEGATIVE
Neisseria Gonorrhoeae by PCR: NEGATIVE

## 2024-12-09 ENCOUNTER — Ambulatory Visit: Admitting: Family

## 2024-12-09 ENCOUNTER — Encounter: Payer: Self-pay | Admitting: Nurse Practitioner

## 2024-12-09 ENCOUNTER — Ambulatory Visit

## 2024-12-09 ENCOUNTER — Ambulatory Visit: Admitting: Nurse Practitioner

## 2024-12-09 DIAGNOSIS — M545 Low back pain, unspecified: Secondary | ICD-10-CM

## 2024-12-09 DIAGNOSIS — Z6841 Body Mass Index (BMI) 40.0 and over, adult: Secondary | ICD-10-CM

## 2024-12-09 DIAGNOSIS — E66813 Obesity, class 3: Secondary | ICD-10-CM

## 2024-12-09 MED ORDER — PREDNISONE 20 MG PO TABS: 20.0000 mg | ORAL_TABLET | Freq: Two times a day (BID) | ORAL | 0 refills | Status: DC

## 2024-12-09 NOTE — Progress Notes (Signed)
 Subjective:  Patient ID: Lonnie Cantrell., male    DOB: 01/30/1986, 38 y.o.   MRN: [id number]  Patient Care Team: Deitra Morton Sebastian Nena, NP as PCP - General (Nurse Practitioner)   Chief Complaint:  Back Pain (Lower back pain on right side for 1 week )   HPI: Lonnie Cantrell. is a 38 y.o. male presenting on 12/09/2024 for Back Pain (Lower back pain on right side for 1 week )   Discussed the use of AI scribe software for clinical note transcription with the patient, who gave verbal consent to proceed.  History of Present Illness Lonnie Cantrell. is a 38 year old male who presents with lower back pain.  He has been experiencing lower back pain primarily on the right side, near the kidney area, for the past week. The pain is most severe upon waking in the morning, described as a 'vice grip' sensation that takes his breath away. It does not radiate down his legs but remains in the upper torso area. The pain is exacerbated by sitting and improves with movement.  He has been taking ibuprofen, which provides some relief, allowing him to function more normally. He was also prescribed tizanidine 4 mg at bedtime and received a steroid shot, which has reduced the pain from a 9 to a 5 on a scale of 10. Despite this, he still experiences significant discomfort and stiffness, particularly when getting out of bed.  He works in a job that involves lifting and being on his feet for about three to four hours a day on various surfaces, including concrete and asphalt. He is more on his feet than sitting down during work hours.  A urinalysis was performed at his workplace earlier this week, which returned normal results.       12/09/2024   12:23 PM 12/13/2022    4:27 PM  GAD 7 : Generalized Anxiety Score  Nervous, Anxious, on Edge 0 0  Control/stop worrying 0 0  Worry too much - different things 0 0  Trouble relaxing 2 0  Restless 0 0  Easily annoyed or irritable 1 0  Afraid - awful  might happen 0 0  Total GAD 7 Score 3 0  Anxiety Difficulty Not difficult at all Not difficult at all        12/09/2024   12:23 PM 12/13/2022    4:26 PM 05/08/2022    2:08 PM  PHQ9 SCORE ONLY  PHQ-9 Total Score 0 0  0      Data saved with a previous flowsheet row definition     Relevant past medical, surgical, family, and social history reviewed and updated as indicated.  Allergies and medications reviewed and updated. Data reviewed: Chart in Epic.   Past Medical History:  Diagnosis Date   Severe obstructive sleep apnea 01/12/2019    Past Surgical History:  Procedure Laterality Date   NECK SURGERY  06/06/2019   C4 vertabre broke    Social History   Socioeconomic History   Marital status: Single    Spouse name: Not on file   Number of children: Not on file   Years of education: Not on file   Highest education level: Not on file  Occupational History   Not on file  Tobacco Use   Smoking status: Never   Smokeless tobacco: Never  Vaping Use   Vaping status: Never Used  Substance and Sexual Activity   Alcohol use: Not Currently   Drug use: Never  Sexual activity: Yes    Birth control/protection: Condom  Other Topics Concern   Not on file  Social History Narrative   Not on file   Social Drivers of Health   Tobacco Use: Low Risk (12/09/2024)   Patient History    Smoking Tobacco Use: Never    Smokeless Tobacco Use: Never    Passive Exposure: Not on file  Financial Resource Strain: Not on file  Food Insecurity: Not on file  Transportation Needs: Not on file  Physical Activity: Not on file  Stress: Not on file  Social Connections: Not on file  Intimate Partner Violence: Not on file  Depression (PHQ2-9): Low Risk (12/09/2024)   Depression (PHQ2-9)    PHQ-2 Score: 0  Alcohol Screen: Not on file  Housing: Not on file  Utilities: Not on file  Health Literacy: Not on file    Outpatient Encounter Medications as of 12/09/2024  Medication Sig    levocetirizine (XYZAL) 5 MG tablet Take 5 mg by mouth daily.   predniSONE (DELTASONE) 20 MG tablet Take 1 tablet (20 mg total) by mouth 2 (two) times daily with a meal.   tiZANidine (ZANAFLEX) 4 MG tablet Take 4 mg by mouth at bedtime.   No facility-administered encounter medications on file as of 12/09/2024.    Allergies[1]  Review of Systems  Constitutional:  Negative for chills and fever.  HENT:  Negative for congestion and sore throat.   Respiratory:  Negative for cough, shortness of breath and wheezing.   Gastrointestinal:  Negative for blood in stool, constipation, nausea and vomiting.  Genitourinary:  Negative for dysuria, flank pain and urgency.  Musculoskeletal:  Positive for back pain. Negative for falls.       5/10 pain in the AM  Skin:  Negative for itching and rash.  Neurological:  Negative for dizziness and headaches.         Objective:  BP 116/70   Pulse 61   Temp (!) 97.3 F (36.3 C) (Temporal)   Ht 5' 6 (1.676 m)   Wt 276 lb (125.2 kg)   SpO2 100%   BMI 44.55 kg/m    Wt Readings from Last 3 Encounters:  12/09/24 276 lb (125.2 kg)  12/13/22 258 lb 6 oz (117.2 kg)  05/21/22 276 lb 6.4 oz (125.4 kg)   BP Readings from Last 3 Encounters:  12/09/24 116/70  12/13/22 117/61  05/21/22 128/76    Physical Exam Vitals and nursing note reviewed.  Constitutional:      Appearance: He is obese.  HENT:     Head: Normocephalic and atraumatic.     Nose: Nose normal.     Mouth/Throat:     Mouth: Mucous membranes are moist.  Eyes:     General: No scleral icterus.    Conjunctiva/sclera: Conjunctivae normal.     Pupils: Pupils are equal, round, and reactive to light.  Cardiovascular:     Heart sounds: Normal heart sounds.  Pulmonary:     Effort: Pulmonary effort is normal.     Breath sounds: Normal breath sounds.  Musculoskeletal:        General: Normal range of motion.     Cervical back: Normal.     Thoracic back: Normal.     Lumbar back: Tenderness  present. Normal range of motion. Negative right straight leg raise test and negative left straight leg raise test.     Right lower leg: No edema.     Left lower leg: No edema.  Skin:  General: Skin is warm and dry.     Findings: No rash.  Neurological:     Mental Status: He is alert and oriented to person, place, and time.  Psychiatric:        Mood and Affect: Mood normal.        Behavior: Behavior normal.        Thought Content: Thought content normal.        Judgment: Judgment normal.    Physical Exam MEASUREMENTS: BMI- 44.55.     Results for orders placed or performed in visit on 12/13/22  GC/Chlamydia Probe Amp   Collection Time: 12/13/22  4:44 PM   Specimen: Urine   UR  Result Value Ref Range   Chlamydia trachomatis, NAA Negative Negative   Neisseria Gonorrhoeae by PCR Negative Negative  HSV-2 Ab, IgG   Collection Time: 12/13/22  4:46 PM  Result Value Ref Range   HSV 2 IgG, Type Spec <0.91 0.00 - 0.90 index  HepB+HepC+HIV Panel   Collection Time: 12/13/22  4:53 PM  Result Value Ref Range   Hepatitis B Surface Ag Negative Negative   Hep B E Ag Negative Negative   Hep B C IgM Negative Negative   Hep B Core Total Ab Negative Negative   Hep B E Ab Negative Negative   Hep B Surface Ab, Qual Reactive    Hep C Virus Ab Non Reactive Non Reactive   HIV Screen 4th Generation wRfx Non Reactive Non Reactive  RPR   Collection Time: 12/13/22  4:53 PM  Result Value Ref Range   RPR Ser Ql Non Reactive Non Reactive       Pertinent labs & imaging results that were available during my care of the patient were reviewed by me and considered in my medical decision making.  Assessment & Plan:  Burnard was seen today for back pain.  Diagnoses and all orders for this visit:  Acute right-sided low back pain without sciatica -     DG Lumbar Spine 2-3 Views -     predniSONE (DELTASONE) 20 MG tablet; Take 1 tablet (20 mg total) by mouth 2 (two) times daily with a meal.  Class 3  severe obesity due to excess calories without serious comorbidity with body mass index (BMI) of 40.0 to 44.9 in adult [facility])     Assessment and Plan Amarie is a 38 year old African-American male seen today for back pain, no acute distress Assessment & Plan Acute right-sided low back pain Acute right-sided low back pain with sharp, aching, and stabbing characteristics. Previous urinalysis normal. Minimal relief from Zanaflex and prednisone shot. Pain level reduced from 9 to 5 with current treatment. - Ordered x-ray of the back, awaiting final report from radiologist - Prescribed prednisone 20 mg twice daily with food for 5 days. - Continue Zanaflex at bedtime. - Advised against taking ibuprofen while on steroids.  Class 3 severe obesity BMI of 44.55. No blood work was done at the last clinic visit on December 13, 2022. No primary care provider currently managing weight. - Advised establishing care with a primary care provider. - Recommended scheduling a physical exam and blood work. - Discussed potential need for weight loss medication.      Continue all other maintenance medications.  Follow up plan: Return in about 8 weeks (around 02/03/2025) for physical with  labs.   Continue healthy lifestyle choices, including diet (rich in fruits, vegetables, and lean proteins, and low in salt and simple carbohydrates) and exercise (at least  30 minutes of moderate physical activity daily).  Educational handout given for   Clinical References  Acute Back Pain, Adult Acute back pain is sudden and usually short-lived. It is often caused by an injury to the muscles and tissues in the back. The injury may result from: A muscle, tendon, or ligament getting overstretched or torn. Ligaments are tissues that connect bones to each other. Lifting something improperly can cause a back strain. Wear and tear (degeneration) of the spinal disks. Spinal disks are circular tissue that provide cushioning between  the bones of the spine (vertebrae). Twisting motions, such as while playing sports or doing yard work. A hit to the back. Arthritis. You may have a physical exam, lab tests, and imaging tests to find the cause of your pain. Acute back pain usually goes away with rest and home care. Follow these instructions at home: Managing pain, stiffness, and swelling Take over-the-counter and prescription medicines only as told by your health care provider. Treatment may include medicines for pain and inflammation that are taken by mouth or applied to the skin, or muscle relaxants. Your health care provider may recommend applying ice during the first 24-48 hours after your pain starts. To do this: Put ice in a plastic bag. Place a towel between your skin and the bag. Leave the ice on for 20 minutes, 2-3 times a day. Remove the ice if your skin turns bright red. This is very important. If you cannot feel pain, heat, or cold, you have a greater risk of damage to the area. If directed, apply heat to the affected area as often as told by your health care provider. Use the heat source that your health care provider recommends, such as a moist heat pack or a heating pad. Place a towel between your skin and the heat source. Leave the heat on for 20-30 minutes. Remove the heat if your skin turns bright red. This is especially important if you are unable to feel pain, heat, or cold. You have a greater risk of getting burned. Activity  Do not stay in bed. Staying in bed for more than 1-2 days can delay your recovery. Sit up and stand up straight. Avoid leaning forward when you sit or hunching over when you stand. If you work at a desk, sit close to it so you do not need to lean over. Keep your chin tucked in. Keep your neck drawn back, and keep your elbows bent at a 90-degree angle (right angle). Sit high and close to the steering wheel when you drive. Add lower back (lumbar) support to your car seat, if  needed. Take short walks on even surfaces as soon as you are able. Try to increase the length of time you walk each day. Do not sit, drive, or stand in one place for more than 30 minutes at a time. Sitting or standing for long periods of time can put stress on your back. Do not drive or use heavy machinery while taking prescription pain medicine. Use proper lifting techniques. When you bend and lift, use positions that put less stress on your back: [place] your knees. Keep the load close to your body. Avoid twisting. Exercise regularly as told by your health care provider. Exercising helps your back heal faster and helps prevent back injuries by keeping muscles strong and flexible. Work with a physical therapist to make a safe exercise program, as recommended by your health care provider. Do any exercises as told by your physical therapist. Lifestyle  Maintain a healthy weight. Extra weight puts stress on your back and makes it difficult to have good posture. Avoid activities or situations that make you feel anxious or stressed. Stress and anxiety increase muscle tension and can make back pain worse. Learn ways to manage anxiety and stress, such as through exercise. General instructions Sleep on a firm mattress in a comfortable position. Try lying on your side with your knees slightly bent. If you lie on your back, put a pillow under your knees. Keep your head and neck in a straight line with your spine (neutral position) when using electronic equipment like smartphones or pads. To do this: Raise your smartphone or pad to look at it instead of bending your head or neck to look down. Put the smartphone or pad at the level of your face while looking at the screen. Follow your treatment plan as told by your health care provider. This may include: Cognitive or behavioral therapy. Acupuncture or massage therapy. Meditation or yoga. Contact a health care provider if: You have pain that is not relieved  with rest or medicine. You have increasing pain going down into your legs or buttocks. Your pain does not improve after 2 weeks. You have pain at night. You lose weight without trying. You have a fever or chills. You develop nausea or vomiting. You develop abdominal pain. Get help right away if: You develop new bowel or bladder control problems. You have unusual weakness or numbness in your arms or legs. You feel faint. These symptoms may represent a serious problem that is an emergency. Do not wait to see if the symptoms will go away. Get medical help right away. Call your local emergency services (911 in the [place] Do not drive yourself to the hospital. Summary Acute back pain is sudden and usually short-lived. Use proper lifting techniques. When you bend and lift, use positions that put less stress on your back. Take over-the-counter and prescription medicines only as told by your health care provider, and apply heat or ice as told. This information is not intended to replace advice given to you by your health care provider. Make sure you discuss any questions you have with your health care provider. Document Revised: 03/09/2021 Document Reviewed: 03/09/2021 Elsevier Patient Education  2024 [employer]. BMI for Adults Body mass index (BMI) is a number found using a person's weight and height. BMI can help tell how much of a person's weight is made up of fat. BMI does not measure body fat directly. It is used instead of tests that directly measure body fat, which can be difficult and expensive. What are BMI measurements used for? BMI is useful to: Find out if your weight puts you at higher risk for medical problems. Help recommend changes, such as in diet and exercise. This can help you reach a healthy weight. BMI screening can be done again to see if these changes are working. How is BMI calculated? Your height and weight are measured. The BMI is found from those numbers. This can be  done with [place] or metric measurements. Note that charts and online BMI calculators are available to help you find your BMI quickly and easily without doing these calculations. To calculate your BMI in [place] measurements: Measure your weight in pounds (lb). Multiply the number of pounds by 703. So, for an adult who weighs 150 lb, multiply that number by 703: 150 x 703, which equals 105,450. Measure your height in inches. Then multiply that number by itself  to get a measurement called inches squared. So, for an adult who is 70 inches tall, the inches squared measurement is 70 inches x 70 inches, which equals 4,900 inches squared. Divide the total from step 2 (number of lb x 703) by the total from step 3 (inches squared): 105,450  4,900 = 21.5. This is your BMI. To calculate your BMI in metric measurements:  Measure your weight in kilograms (kg). For this example, the weight is 70 kg. Measure your height in meters (m). Then multiply that number by itself to get a measurement called meters squared. So, for an adult who is 1.75 m tall, the meters squared measurement is 1.75 m x 1.75 m, which equals 3.1 meters squared. Divide the number of kilograms (your weight) by the meters squared number. In this example: 70  3.1 = 22.6. This is your BMI. What do the results mean? BMI charts are used to see if you are underweight, normal weight, overweight, or obese. The following guidelines will be used: Underweight: BMI less than 18.5. Normal weight: BMI between 18.5 and 24.9. Overweight: BMI between 25 and 29.9. Obese: BMI of 30 or above. BMI is a tool and cannot diagnose a condition. Talk with your health care provider about what your BMI means for you. Keep these notes in mind: Weight includes fat and muscle. Someone with a muscular build, such as an athlete, may have a BMI that is higher than 24.9. In cases like these, BMI is not a correct measure of body fat. If you have a BMI of 25 or higher,  your provider may need to do more testing to find out if excess body fat is the cause. BMI is measured the same way for males and females. Females usually have more body fat than males of the same height and weight. Where to find more information For more information about BMI, including tools to quickly find your BMI, go to: Centers for Disease Control and Prevention: tonerpromos.no American Heart Association: heart.org National Heart, Lung, and Blood Institute: buffalodrycleaner.gl This information is not intended to replace advice given to you by your health care provider. Make sure you discuss any questions you have with your health care provider. Document Revised: 09/05/2022 Document Reviewed: 08/29/2022 Elsevier Patient Education  2024 Elsevier Inc. Obesity, Adult Obesity is the condition of having too much total body fat. Being overweight or obese means that your weight is greater than what is considered healthy for your body size. Obesity is determined by a measurement called BMI (body mass index). BMI is an estimate of body fat and is calculated from height and weight. For adults, a BMI of 30 or higher is considered obese. Obesity can lead to other health concerns and major illnesses, including: Stroke. Coronary artery disease (CAD). Type 2 diabetes. Some types of cancer, including cancers of the colon, breast, uterus, and gallbladder. High blood pressure (hypertension). High cholesterol. Gallbladder stones. Obesity can also contribute to: Osteoarthritis. Sleep apnea. Infertility problems. What are the causes? Common causes of this condition include: Eating daily meals that are high in calories, sugar, and fat. Drinking high amounts of sugar-sweetened beverages, such as soft drinks. Being born with genes that may make you more likely to become obese. Having a medical condition that causes obesity, including: Hypothyroidism. Polycystic ovarian syndrome (PCOS). Binge-eating  disorder. Cushing syndrome. Taking certain medicines, such as steroids, antidepressants, and seizure medicines. Not being physically active (sedentary lifestyle). Not getting enough sleep. What increases the risk? The following factors may  make you more likely to develop this condition: Having a family history of obesity. Living in an area with limited access to: [place], recreation centers, or sidewalks. Healthy food choices, such as grocery stores and farmers' markets. What are the signs or symptoms? The main sign of this condition is having too much body fat. How is this diagnosed? This condition is diagnosed based on: Your BMI. If you are an adult with a BMI of 30 or higher, you are considered obese. Your waist circumference. This measures the distance around your waistline. Your skinfold thickness. Your health care provider may gently pinch a fold of your skin and measure it. You may have other tests to check for underlying conditions. How is this treated? Treatment for this condition often includes changing your lifestyle. Treatment may include some or all of the following: Dietary changes. This may include developing a healthy meal plan. Regular physical activity. This may include activity that causes your heart to beat faster (aerobic exercise) and strength training. Work with your health care provider to design an exercise program that works for you. Medicine to help you lose weight if you are unable to lose one pound a week after six weeks of healthy eating and more physical activity. Treating conditions that cause the obesity (underlying conditions). Surgery. Surgical options may include gastric banding and gastric bypass. Surgery may be done if: Other treatments have not helped to improve your condition. You have a BMI of 40 or higher. You have life-threatening health problems related to obesity. Follow these instructions at home: Eating and drinking  Follow recommendations  from your health care provider about what you eat and drink. Your health care provider may advise you to: Limit fast food, sweets, and processed snack foods. Choose low-fat options, such as low-fat milk instead of whole milk. Eat five or more servings of fruits or vegetables every day. Choose healthy foods when you eat out. Keep low-fat snacks available. Limit sugary drinks, such as soda, fruit juice, sweetened iced tea, and flavored milk. Drink enough water to keep your urine pale yellow. Do not follow a fad diet. Fad diets can be unhealthy and even dangerous. Other healthful choices include: Eat at home more often. This gives you more control over what you eat. Learn to read food labels. This will help you understand how much food is considered one serving. Learn what a healthy serving size is. Physical activity Exercise regularly, as told by your health care provider. Most adults should get up to 150 minutes of moderate-intensity exercise every week. Ask your health care provider what types of exercise are safe for you and how often you should exercise. Warm up and stretch before being active. Cool down and stretch after being active. Rest between periods of activity. Lifestyle Work with your health care provider and a dietitian to set a weight-loss goal that is healthy and reasonable for you. Limit your screen time. Find ways to reward yourself that do not involve food. Do not drink alcohol if: Your health care provider tells you not to drink. You are pregnant, may be pregnant, or are planning to become pregnant. If you drink alcohol: Limit how much you have to: 0-1 drink a day for women. 0-2 drinks a day for men. Know how much alcohol is in your drink. In the [place] one drink equals one 12 oz bottle of beer (355 mL), one 5 oz glass of wine (148 mL), or one 1 oz glass of hard liquor (44 mL). General instructions  Keep a weight-loss journal to keep track of the food you eat and how  much exercise you get. Take over-the-counter and prescription medicines only as told by your health care provider. Take vitamins and supplements only as told by your health care provider. Consider joining a support group. Your health care provider may be able to recommend a support group. Pay attention to your mental health as obesity can lead to depression or self esteem issues. Keep all follow-up visits. This is important. Contact a health care provider if: You are unable to meet your weight-loss goal after six weeks of dietary and lifestyle changes. You have trouble breathing. Summary Obesity is the condition of having too much total body fat. Being overweight or obese means that your weight is greater than what is considered healthy for your body size. Work with your health care provider and a dietitian to set a weight-loss goal that is healthy and reasonable for you. Exercise regularly, as told by your health care provider. Ask your health care provider what types of exercise are safe for you and how often you should exercise. This information is not intended to replace advice given to you by your health care provider. Make sure you discuss any questions you have with your health care provider. Document Revised: 07/24/2021 Document Reviewed: 07/24/2021 Elsevier Patient Education  2024 [employer]  The above assessment and management plan was discussed with the patient. The patient verbalized understanding of and has agreed to the management plan. Patient is aware to call the clinic if they develop any new symptoms or if symptoms persist or worsen. Patient is aware when to return to the clinic for a follow-up visit. Patient educated on when it is appropriate to go to the emergency department.   Rhyse Skowron [place] Thompson, DNP [facility] [address] [place], [place] [address] [phone]       [1] No Known Allergies

## 2024-12-20 ENCOUNTER — Ambulatory Visit: Payer: Self-pay | Admitting: Nurse Practitioner

## 2025-02-02 ENCOUNTER — Encounter: Payer: Self-pay | Admitting: Family Medicine

## 2025-02-02 ENCOUNTER — Ambulatory Visit: Payer: Self-pay | Admitting: Family Medicine

## 2025-02-02 ENCOUNTER — Ambulatory Visit: Admitting: Family Medicine

## 2025-02-02 ENCOUNTER — Encounter: Admitting: Nurse Practitioner

## 2025-02-02 VITALS — BP 122/70 | HR 75 | Temp 97.3°F | Ht 66.0 in | Wt 272.0 lb

## 2025-02-02 DIAGNOSIS — G4733 Obstructive sleep apnea (adult) (pediatric): Secondary | ICD-10-CM

## 2025-02-02 DIAGNOSIS — Z130 Encounter for screening for diseases of the blood and blood-forming organs and certain disorders involving the immune mechanism: Secondary | ICD-10-CM

## 2025-02-02 DIAGNOSIS — Z6841 Body Mass Index (BMI) 40.0 and over, adult: Secondary | ICD-10-CM | POA: Diagnosis not present

## 2025-02-02 DIAGNOSIS — Z0184 Encounter for antibody response examination: Secondary | ICD-10-CM

## 2025-02-02 DIAGNOSIS — E66813 Obesity, class 3: Secondary | ICD-10-CM | POA: Diagnosis not present

## 2025-02-02 DIAGNOSIS — Z0000 Encounter for general adult medical examination without abnormal findings: Secondary | ICD-10-CM

## 2025-02-02 DIAGNOSIS — Z0001 Encounter for general adult medical examination with abnormal findings: Secondary | ICD-10-CM

## 2025-02-02 DIAGNOSIS — Z136 Encounter for screening for cardiovascular disorders: Secondary | ICD-10-CM

## 2025-02-02 LAB — BAYER DCA HB A1C WAIVED: HB A1C (BAYER DCA - WAIVED): 5.7 % — ABNORMAL HIGH (ref 4.8–5.6)

## 2025-02-02 NOTE — Progress Notes (Signed)
 "  Complete physical exam  Patient: Lonnie Golomb Jr.   DOB: 07/07/1986   39 y.o. Male  MRN: [id number]  Subjective:    Chief Complaint  Patient presents with   Establish Care    Previous sandra patient     Lonnie Ruesch. is a 39 y.o. male who presents today for a complete physical exam. He reports consuming a general diet. The patient does not participate in regular exercise at present. He generally feels well. He reports sleeping fairly well. He does not have additional problems to discuss today.    He has a history of sleep apnea managed with a CPAP machine, which improves his sleep quality. He notices a significant difference in his well-being when he does not use the CPAP.  In 2023, he experienced an episode of cellulitis that caused significant leg pain and impaired his ability to walk. The area was painful to touch, and he required medication for treatment. He does not recall any specific injury that led to the cellulitis.  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 The pain occurred while eating and was not associated with a need to use the bathroom. He has not experienced this pain since that period. He notes eating less since switching to night shifts.  He experiences shortness of breath, which he attributes to a lack of physical activity, and states that it goes away quickly. No leg swelling, chest pain, or significant changes in appetite.  He does not take any medications other than the CPAP and has no history of prescription medication use aside from treatment for the cellulitis. He denies any issues with vision, hearing, or dental health. He does not smoke, drink, vape, or use street drugs.      Most recent fall risk assessment:    02/02/2025    2:52 PM  Fall Risk   Falls in the past year? 0  Risk for fall due to : No Fall Risks  Follow up Falls evaluation completed     Most recent depression  screenings:    02/02/2025    2:52 PM 12/09/2024   12:23 PM  PHQ 2/9 Scores  PHQ - 2 Score 0 0  PHQ- 9 Score 1 0    Vision:Not within last year  and Dental: No current dental problems and Receives regular dental care  Patient Active Problem List   Diagnosis Date Noted   Severe obstructive sleep apnea 01/12/2019   Class 3 severe obesity due to excess calories without serious comorbidity with body mass index (BMI) of 40.0 to 44.9 in adult [facility]) 12/02/2018   Past Medical History:  Diagnosis Date   Severe obstructive sleep apnea 01/12/2019   Past Surgical History:  Procedure Laterality Date   NECK SURGERY  06/06/2019   C4 vertabre broke   Social History[1] Social History   Socioeconomic History   Marital status: Single    Spouse name: Not on file   Number of children: Not on file   Years of education: Not on file   Highest education level: Not on file  Occupational History   Not on file  Tobacco Use   Smoking status: Never   Smokeless tobacco: Never  Vaping Use   Vaping status: Never Used  Substance and Sexual Activity   Alcohol use: Yes    Comment: rare   Drug use: Never   Sexual activity: Yes    Birth control/protection: Condom  Other Topics Concern  Not on file  Social History Narrative   Not on file   Social Drivers of Health   Tobacco Use: Low Risk (02/02/2025)   Patient History    Smoking Tobacco Use: Never    Smokeless Tobacco Use: Never    Passive Exposure: Not on file  Financial Resource Strain: Not on file  Food Insecurity: Not on file  Transportation Needs: Not on file  Physical Activity: Not on file  Stress: Not on file  Social Connections: Not on file  Intimate Partner Violence: Not on file  Depression (PHQ2-9): Low Risk (02/02/2025)   Depression (PHQ2-9)    PHQ-2 Score: 1  Alcohol Screen: Not on file  Housing: Not on file  Utilities: Not on file  Health Literacy: Not on file   Family Status  Relation Name Status   Mother  Deceased    Father  Deceased   Sister Lonnie Cantrell Alive   Sister Lonnie Cantrell Alive   Sister Lonnie Cantrell Alive   Sister Lonnie Cantrell Alive   Brother 1/2 Alive   Daughter Lonnie Cantrell Alive   Son Lonnie Cantrell Alive   Son Lonnie Cantrell Alive   Son Lonnie Cantrell Alive   MGM  Alive   MGF  Alive   PGM  Alive   PGF  Alive  No partnership data on file   Family History  Problem Relation Age of Onset   Pancreatic cancer Mother    Drug abuse Father    Aneurysm Sister        brain   Sleep apnea Sister    ADD / ADHD Daughter    ODD Daughter    Other Son        sensory processing disorder   Snoring Son    Ovarian cancer Paternal Grandmother    Allergies[2]    Patient Care Team: Severa Rock HERO, FNP as PCP - General (Family Medicine)   Show/hide medication list[3]  ROS per HPI      Objective:     BP 122/70   Pulse 75   Temp (!) 97.3 F (36.3 C)   Ht 5' 6 (1.676 m)   Wt 272 lb (123.4 kg)   SpO2 93%   BMI 43.90 kg/m  BP Readings from Last 3 Encounters:  02/02/25 122/70  12/09/24 116/70  12/13/22 117/61   Wt Readings from Last 3 Encounters:  02/02/25 272 lb (123.4 kg)  12/09/24 276 lb (125.2 kg)  12/13/22 258 lb 6 oz (117.2 kg)   SpO2 Readings from Last 3 Encounters:  02/02/25 93%  12/09/24 100%  12/13/22 96%      Physical Exam Vitals and nursing note reviewed.  Constitutional:      General: He is not in acute distress.    Appearance: Normal appearance. He is well-developed and well-groomed. He is morbidly obese. He is not ill-appearing, toxic-appearing or diaphoretic.  HENT:     Head: Normocephalic and atraumatic.     Jaw: There is normal jaw occlusion.     Right Ear: Hearing, tympanic membrane, ear canal and external ear normal.     Left Ear: Hearing, tympanic membrane, ear canal and external ear normal.     Nose: Nose normal.     Mouth/Throat:     Lips: Pink.     Mouth: Mucous membranes are moist.     Pharynx: Oropharynx is clear. Uvula midline.  Eyes:     General: Lids are normal.     Extraocular  Movements: Extraocular movements intact.     Conjunctiva/sclera: Conjunctivae normal.  Pupils: Pupils are equal, round, and reactive to light.  Neck:     Thyroid : No thyroid  mass, thyromegaly or thyroid  tenderness.     Vascular: No carotid bruit or JVD.     Trachea: Trachea and phonation normal.  Cardiovascular:     Rate and Rhythm: Normal rate and regular rhythm.     Chest Wall: PMI is not displaced.     Pulses:          Dorsalis pedis pulses are 2+ on the right side and 2+ on the left side.       Posterior tibial pulses are 2+ on the right side and 2+ on the left side.     Heart sounds: Normal heart sounds. No murmur heard.    No friction rub. No gallop.  Pulmonary:     Effort: Pulmonary effort is normal. No respiratory distress.     Breath sounds: Normal breath sounds. No wheezing.  Chest:     Chest wall: No mass.  Breasts:    Breasts are symmetrical.  Abdominal:     General: Abdomen is flat. Bowel sounds are normal. There is no distension or abdominal bruit.     Palpations: Abdomen is soft. There is no hepatomegaly or splenomegaly.     Tenderness: There is no abdominal tenderness. There is no right CVA tenderness or left CVA tenderness.     Hernia: No hernia is present.  Musculoskeletal:        General: Normal range of motion.     Cervical back: Full passive range of motion without pain, normal range of motion and neck supple.     Right lower leg: No edema.     Left lower leg: No edema.  Feet:     Right foot:     Skin integrity: Skin integrity normal.     Toenail Condition: Right toenails are normal.     Left foot:     Skin integrity: Skin integrity normal.     Toenail Condition: Left toenails are normal.  Lymphadenopathy:     Cervical: No cervical adenopathy.     Upper Body:     Right upper body: No supraclavicular, axillary or pectoral adenopathy.     Left upper body: No supraclavicular, axillary or pectoral adenopathy.  Skin:    General: Skin is warm and dry.      Capillary Refill: Capillary refill takes less than 2 seconds.     Coloration: Skin is not cyanotic, jaundiced or pale.     Findings: No rash.  Neurological:     General: No focal deficit present.     Mental Status: He is alert and oriented to person, place, and time.     Sensory: Sensation is intact.     Motor: Motor function is intact.     Coordination: Coordination is intact.     Gait: Gait is intact.     Deep Tendon Reflexes: Reflexes are normal and symmetric.  Psychiatric:        Attention and Perception: Attention and perception normal.        Mood and Affect: Mood and affect normal.        Speech: Speech normal.        Behavior: Behavior normal. Behavior is cooperative.        Thought Content: Thought content normal.        Cognition and Memory: Cognition and memory normal.        Judgment: Judgment normal.  Last CBC Lab Results  Component Value Date   WBC 12.3 (H) 05/08/2022   HGB 14.7 05/08/2022   HCT 45.1 05/08/2022   MCV 85 05/08/2022   MCH 27.5 05/08/2022   RDW 13.5 05/08/2022   PLT 348 05/08/2022   Last metabolic panel Lab Results  Component Value Date   GLUCOSE 96 04/30/2022   NA 138 04/30/2022   K 3.7 04/30/2022   CL 109 04/30/2022   CO2 23 04/30/2022   BUN 13 04/30/2022   CREATININE 0.91 04/30/2022   GFRNONAA >60 04/30/2022   CALCIUM 8.5 (L) 04/30/2022   PROT 7.8 04/30/2022   ALBUMIN 3.5 04/30/2022   BILITOT 0.3 04/30/2022   ALKPHOS 64 04/30/2022   AST 25 04/30/2022   ALT 51 (H) 04/30/2022   ANIONGAP 6 04/30/2022   Last lipids Lab Results  Component Value Date   CHOL 131 12/04/2018   HDL 28 (L) 12/04/2018   LDLCALC 85 12/04/2018   TRIG 87 12/04/2018   CHOLHDL 4.7 12/04/2018    Last thyroid  functions Lab Results  Component Value Date   TSH 2.86 12/04/2018        Assessment & Plan:    Routine Health Maintenance and Physical Exam  Immunization History  Administered Date(s) Administered   Tdap 05/18/2019    Health  Maintenance  Topic Date Due   COVID-19 Vaccine (1 - 2025-26 season) Never done   Influenza Vaccine  03/29/2025 (Originally 07/30/2024)   Hepatitis B Vaccines 19-59 Average Risk (1 of 3 - 19+ 3-dose series) 12/09/2025 (Originally 03/29/2005)   DTaP/Tdap/Td (2 - Td or Tdap) 05/17/2029   HPV VACCINES (No Doses Required) Completed   Hepatitis C Screening  Completed   HIV Screening  Completed   Pneumococcal Vaccine  Aged Out   Meningococcal B Vaccine  Aged Out    Discussed health benefits of physical activity, and encouraged him to engage in regular exercise appropriate for his age and condition.  Problem List Items Addressed This Visit       Respiratory   Severe obstructive sleep apnea   Relevant Orders   CBC with Differential/Platelet     Other   Class 3 severe obesity due to excess calories without serious comorbidity with body mass index (BMI) of 40.0 to 44.9 in adult [facility])   Relevant Orders   CMP14+EGFR   CBC with Differential/Platelet   Lipid panel   TSH   VITAMIN D 25 Hydroxy (Vit-D Deficiency, Fractures)   T4, Free   Bayer DCA Hb A1c Waived   Other Visit Diagnoses       Annual physical exam    -  Primary   Relevant Orders   CMP14+EGFR   CBC with Differential/Platelet   Lipid panel     Immunity status testing       Relevant Orders   Hepatitis B surface antibody,quantitative     Encounter for screening for cardiovascular disorders       Relevant Orders   CMP14+EGFR   CBC with Differential/Platelet   Lipid panel     Screening for endocrine, nutritional, metabolic and immunity disorder       Relevant Orders   CMP14+EGFR   CBC with Differential/Platelet   Lipid panel   TSH   VITAMIN D 25 Hydroxy (Vit-D Deficiency, Fractures)   T4, Free   Bayer DCA Hb A1c Waived         Obstructive sleep apnea Managed with CPAP therapy. Reports improvement in symptoms with CPAP use and feels a difference  when not using it. Declined tirzepatide or Zepbound for sleep apnea  management. - Continue CPAP therapy  General Health Maintenance Routine health maintenance discussed. No recent blood work since cellulitis in 2023. No family history of colorectal or prostate cancer. Tetanus vaccination is up to date. No issues with vision, hearing, or dental health. No significant changes in appetite except for decreased intake due to night shift work. No smoking, alcohol, or drug use. - Ordered comprehensive blood work including kidney and liver function tests, and blood counts - Advised to report any recurrence of stomach pain or altered bowel movements - Instructed to inform lab staff that today's visit was a physical to ensure insurance coverage - Scheduled follow-up for next annual physical in one year       Return in about 1 year (around 02/02/2026) for Annual Physical.     Rosaline Bruns, FNP      [1]  Social History Tobacco Use   Smoking status: Never   Smokeless tobacco: Never  Vaping Use   Vaping status: Never Used  Substance Use Topics   Alcohol use: Yes    Comment: rare   Drug use: Never  [2] No Known Allergies [3]  Outpatient Medications Prior to Visit  Medication Sig   [DISCONTINUED] levocetirizine (XYZAL) 5 MG tablet Take 5 mg by mouth daily.   [DISCONTINUED] predniSONE  (DELTASONE ) 20 MG tablet Take 1 tablet (20 mg total) by mouth 2 (two) times daily with a meal.   [DISCONTINUED] tiZANidine (ZANAFLEX) 4 MG tablet Take 4 mg by mouth at bedtime.   No facility-administered medications prior to visit.   "

## 2025-02-03 ENCOUNTER — Encounter: Admitting: Nurse Practitioner

## 2025-02-03 LAB — CMP14+EGFR
ALT: 23 [IU]/L (ref 0–44)
AST: 18 [IU]/L (ref 0–40)
Albumin: 4 g/dL — ABNORMAL LOW (ref 4.1–5.1)
Alkaline Phosphatase: 73 [IU]/L (ref 47–123)
BUN/Creatinine Ratio: 11 (ref 9–20)
BUN: 11 mg/dL (ref 6–20)
Bilirubin Total: 0.2 mg/dL (ref 0.0–1.2)
CO2: 23 mmol/L (ref 20–29)
Calcium: 9.1 mg/dL (ref 8.7–10.2)
Chloride: 106 mmol/L (ref 96–106)
Creatinine, Ser: 1.02 mg/dL (ref 0.76–1.27)
Globulin, Total: 2.9 g/dL (ref 1.5–4.5)
Glucose: 88 mg/dL (ref 70–99)
Potassium: 3.9 mmol/L (ref 3.5–5.2)
Sodium: 143 mmol/L (ref 134–144)
Total Protein: 6.9 g/dL (ref 6.0–8.5)
eGFR: 96 mL/min/{1.73_m2}

## 2025-02-03 LAB — CBC WITH DIFFERENTIAL/PLATELET
Basophils Absolute: 0 10*3/uL (ref 0.0–0.2)
Basos: 0 %
EOS (ABSOLUTE): 0.2 10*3/uL (ref 0.0–0.4)
Eos: 2 %
Hematocrit: 43.1 % (ref 37.5–51.0)
Hemoglobin: 14.3 g/dL (ref 13.0–17.7)
Immature Grans (Abs): 0 10*3/uL (ref 0.0–0.1)
Immature Granulocytes: 0 %
Lymphocytes Absolute: 2.3 10*3/uL (ref 0.7–3.1)
Lymphs: 24 %
MCH: 28 pg (ref 26.6–33.0)
MCHC: 33.2 g/dL (ref 31.5–35.7)
MCV: 84 fL (ref 79–97)
Monocytes Absolute: 0.6 10*3/uL (ref 0.1–0.9)
Monocytes: 7 %
Neutrophils Absolute: 6.1 10*3/uL (ref 1.4–7.0)
Neutrophils: 67 %
Platelets: 215 10*3/uL (ref 150–450)
RBC: 5.11 x10E6/uL (ref 4.14–5.80)
RDW: 12.8 % (ref 11.6–15.4)
WBC: 9.3 10*3/uL (ref 3.4–10.8)

## 2025-02-03 LAB — T4, FREE: Free T4: 1.15 ng/dL (ref 0.82–1.77)

## 2025-02-03 LAB — LIPID PANEL
Chol/HDL Ratio: 4.2 ratio (ref 0.0–5.0)
Cholesterol, Total: 126 mg/dL (ref 100–199)
HDL: 30 mg/dL — ABNORMAL LOW
LDL Chol Calc (NIH): 81 mg/dL (ref 0–99)
Triglycerides: 76 mg/dL (ref 0–149)
VLDL Cholesterol Cal: 15 mg/dL (ref 5–40)

## 2025-02-03 LAB — TSH: TSH: 3.37 u[IU]/mL (ref 0.450–4.500)

## 2025-02-03 LAB — VITAMIN D 25 HYDROXY (VIT D DEFICIENCY, FRACTURES): Vit D, 25-Hydroxy: 18.5 ng/mL — ABNORMAL LOW (ref 30.0–100.0)

## 2025-02-03 LAB — HEPATITIS B SURFACE ANTIBODY, QUANTITATIVE: Hepatitis B Surf Ab Quant: 564 m[IU]/mL

## 2026-02-03 ENCOUNTER — Encounter: Admitting: Family Medicine
